# Patient Record
Sex: Female | Born: 1935 | Race: White | Hispanic: No | Smoking: Never smoker
Health system: Southern US, Community
[De-identification: ages and names within clinical notes are randomized; demographics above are authoritative.]

## PROBLEM LIST (undated history)

## (undated) DIAGNOSIS — F411 Generalized anxiety disorder: Secondary | ICD-10-CM

## (undated) DIAGNOSIS — M1990 Unspecified osteoarthritis, unspecified site: Secondary | ICD-10-CM

## (undated) DIAGNOSIS — E119 Type 2 diabetes mellitus without complications: Secondary | ICD-10-CM

## (undated) DIAGNOSIS — I10 Essential (primary) hypertension: Secondary | ICD-10-CM

## (undated) DIAGNOSIS — IMO0001 Reserved for inherently not codable concepts without codable children: Secondary | ICD-10-CM

## (undated) HISTORY — PX: HIP OPEN REDUCTION: SHX1755

## (undated) HISTORY — DX: Essential (primary) hypertension: I10

## (undated) HISTORY — DX: Unspecified osteoarthritis, unspecified site: M19.90

## (undated) HISTORY — DX: Generalized anxiety disorder: F41.1

## (undated) HISTORY — DX: Type 2 diabetes mellitus without complications: E11.9

---

## 2004-04-24 ENCOUNTER — Ambulatory Visit: Payer: Self-pay | Admitting: Internal Medicine

## 2004-07-25 ENCOUNTER — Ambulatory Visit: Payer: Self-pay | Admitting: Internal Medicine

## 2004-10-26 ENCOUNTER — Ambulatory Visit: Payer: Self-pay | Admitting: Internal Medicine

## 2005-02-26 ENCOUNTER — Ambulatory Visit: Payer: Self-pay | Admitting: Internal Medicine

## 2005-06-25 ENCOUNTER — Ambulatory Visit: Payer: Self-pay | Admitting: Internal Medicine

## 2005-09-25 ENCOUNTER — Ambulatory Visit: Payer: Self-pay | Admitting: Internal Medicine

## 2005-10-03 ENCOUNTER — Ambulatory Visit: Payer: Self-pay | Admitting: Internal Medicine

## 2006-02-04 ENCOUNTER — Ambulatory Visit: Payer: Self-pay | Admitting: Internal Medicine

## 2006-05-16 ENCOUNTER — Ambulatory Visit: Payer: Self-pay | Admitting: Internal Medicine

## 2006-10-04 ENCOUNTER — Ambulatory Visit: Payer: Self-pay | Admitting: Internal Medicine

## 2006-10-04 LAB — CONVERTED CEMR LAB
ALT: 15 units/L (ref 0–40)
AST: 19 units/L (ref 0–37)
Bilirubin, Direct: 0.1 mg/dL (ref 0.0–0.3)
CO2: 29 meq/L (ref 19–32)
Calcium: 10.7 mg/dL — ABNORMAL HIGH (ref 8.4–10.5)
Chloride: 106 meq/L (ref 96–112)
Cholesterol: 197 mg/dL (ref 0–200)
GFR calc Af Amer: 78 mL/min
Glucose, Bld: 63 mg/dL — ABNORMAL LOW (ref 70–99)
HCT: 35.6 % — ABNORMAL LOW (ref 36.0–46.0)
Hemoglobin: 12.2 g/dL (ref 12.0–15.0)
LDL Cholesterol: 130 mg/dL — ABNORMAL HIGH (ref 0–99)
MCV: 90.7 fL (ref 78.0–100.0)
Platelets: 355 10*3/uL (ref 150–400)
Sodium: 141 meq/L (ref 135–145)
Total Protein: 7 g/dL (ref 6.0–8.3)

## 2006-12-19 ENCOUNTER — Encounter: Payer: Self-pay | Admitting: Internal Medicine

## 2006-12-19 DIAGNOSIS — I10 Essential (primary) hypertension: Secondary | ICD-10-CM | POA: Insufficient documentation

## 2006-12-19 DIAGNOSIS — E119 Type 2 diabetes mellitus without complications: Secondary | ICD-10-CM

## 2006-12-19 DIAGNOSIS — E1149 Type 2 diabetes mellitus with other diabetic neurological complication: Secondary | ICD-10-CM | POA: Insufficient documentation

## 2006-12-19 DIAGNOSIS — M1990 Unspecified osteoarthritis, unspecified site: Secondary | ICD-10-CM

## 2006-12-19 DIAGNOSIS — F411 Generalized anxiety disorder: Secondary | ICD-10-CM

## 2006-12-19 HISTORY — DX: Generalized anxiety disorder: F41.1

## 2006-12-19 HISTORY — DX: Unspecified osteoarthritis, unspecified site: M19.90

## 2006-12-19 HISTORY — DX: Type 2 diabetes mellitus without complications: E11.9

## 2006-12-19 HISTORY — DX: Essential (primary) hypertension: I10

## 2007-01-03 ENCOUNTER — Ambulatory Visit: Payer: Self-pay | Admitting: Internal Medicine

## 2007-01-06 ENCOUNTER — Telehealth: Payer: Self-pay | Admitting: Internal Medicine

## 2007-04-15 ENCOUNTER — Ambulatory Visit: Payer: Self-pay | Admitting: Internal Medicine

## 2007-08-15 ENCOUNTER — Ambulatory Visit: Payer: Self-pay | Admitting: Internal Medicine

## 2007-08-15 LAB — CONVERTED CEMR LAB: Hgb A1c MFr Bld: 5.7 % (ref 4.6–6.0)

## 2007-11-13 ENCOUNTER — Ambulatory Visit: Payer: Self-pay | Admitting: Internal Medicine

## 2008-02-16 ENCOUNTER — Ambulatory Visit: Payer: Self-pay | Admitting: Internal Medicine

## 2008-02-16 LAB — CONVERTED CEMR LAB: Hgb A1c MFr Bld: 6.5 % — ABNORMAL HIGH (ref 4.6–6.0)

## 2008-04-29 ENCOUNTER — Encounter: Payer: Self-pay | Admitting: Internal Medicine

## 2008-05-18 ENCOUNTER — Ambulatory Visit: Payer: Self-pay | Admitting: Internal Medicine

## 2008-05-18 DIAGNOSIS — J069 Acute upper respiratory infection, unspecified: Secondary | ICD-10-CM | POA: Insufficient documentation

## 2008-05-18 LAB — CONVERTED CEMR LAB: Hgb A1c MFr Bld: 6.4 % — ABNORMAL HIGH (ref 4.6–6.0)

## 2008-08-17 ENCOUNTER — Ambulatory Visit: Payer: Self-pay | Admitting: Internal Medicine

## 2008-08-17 LAB — CONVERTED CEMR LAB: Hgb A1c MFr Bld: 6.1 % (ref 4.6–6.5)

## 2008-10-27 ENCOUNTER — Telehealth (INDEPENDENT_AMBULATORY_CARE_PROVIDER_SITE_OTHER): Payer: Self-pay | Admitting: *Deleted

## 2009-01-25 ENCOUNTER — Ambulatory Visit: Payer: Self-pay | Admitting: Internal Medicine

## 2009-01-25 LAB — CONVERTED CEMR LAB: Hgb A1c MFr Bld: 6 % (ref 4.6–6.5)

## 2009-06-17 ENCOUNTER — Ambulatory Visit: Payer: Self-pay | Admitting: Internal Medicine

## 2009-09-16 ENCOUNTER — Ambulatory Visit: Payer: Self-pay | Admitting: Internal Medicine

## 2009-09-16 LAB — CONVERTED CEMR LAB: Hgb A1c MFr Bld: 6.2 % — ABNORMAL HIGH (ref <5.7)

## 2009-12-16 ENCOUNTER — Ambulatory Visit: Payer: Self-pay | Admitting: Internal Medicine

## 2009-12-16 DIAGNOSIS — H6120 Impacted cerumen, unspecified ear: Secondary | ICD-10-CM | POA: Insufficient documentation

## 2009-12-16 LAB — CONVERTED CEMR LAB: Hgb A1c MFr Bld: 6.8 % — ABNORMAL HIGH (ref 4.6–6.5)

## 2010-03-16 ENCOUNTER — Ambulatory Visit: Payer: Self-pay | Admitting: Internal Medicine

## 2010-03-16 LAB — HM DIABETES EYE EXAM: HM Diabetic Eye Exam: NORMAL

## 2010-04-27 ENCOUNTER — Encounter: Payer: Self-pay | Admitting: Internal Medicine

## 2010-06-07 ENCOUNTER — Encounter: Payer: Self-pay | Admitting: Internal Medicine

## 2010-06-11 LAB — CONVERTED CEMR LAB
Albumin: 4.1 g/dL (ref 3.5–5.2)
Alkaline Phosphatase: 48 units/L (ref 39–117)
Basophils Absolute: 0 10*3/uL (ref 0.0–0.1)
CO2: 28 meq/L (ref 19–32)
Calcium: 10.4 mg/dL (ref 8.4–10.5)
Chloride: 105 meq/L (ref 96–112)
Cholesterol: 192 mg/dL (ref 0–200)
Creatinine, Ser: 1 mg/dL (ref 0.4–1.2)
Eosinophils Absolute: 0.1 10*3/uL (ref 0.0–0.7)
Glucose, Bld: 112 mg/dL — ABNORMAL HIGH (ref 70–99)
HDL: 52.4 mg/dL (ref >39.00)
Hgb A1c MFr Bld: 6.1 % (ref 4.6–6.5)
Lymphocytes Relative: 22.9 % (ref 12.0–46.0)
MCHC: 33 g/dL (ref 30.0–36.0)
MCV: 93.9 fL (ref 78.0–100.0)
Monocytes Absolute: 0.6 10*3/uL (ref 0.1–1.0)
Neutro Abs: 3.8 10*3/uL (ref 1.4–7.7)
Neutrophils Relative %: 65.1 % (ref 43.0–77.0)
RDW: 13.4 % (ref 11.5–14.6)
Total Protein: 7.2 g/dL (ref 6.0–8.3)
Triglycerides: 128 mg/dL (ref 0.0–149.0)

## 2010-06-13 NOTE — Assessment & Plan Note (Signed)
Summary: 3 mo fup/jlp  And a whole in a  Vital Signs:  Patient profile:   77 year old female Weight:      148 pounds Temp:     98.1 degrees F oral BP sitting:   120 / 68  (left arm) Cuff size:   regular  Vitals Entered By: Duard Brady LPN (Sep 16, 1608 3:49 PM) CC: 3 mos rov - doing well     fbs 172 Is Patient Diabetic? Yes Did you bring your meter with you today? No   CC:  3 mos rov - doing well     fbs 172.  History of Present Illness: a 77 year old patient who is seen today for follow-up of her hypertension and type 2 diabetes.  She states that her fasting blood sugars in the morning have trended up over the past few months.  Her last hemoglobin A1c was 6.1.  she also describes some orthostatic dizziness.  She has zoster, arthritis, and anxiety that have been stable.  Her weight is down 2 pounds  Preventive Screening-Counseling & Management  Alcohol-Tobacco     Smoking Status: never  Allergies (verified): No Known Drug Allergies  Review of Systems  The patient denies anorexia, fever, weight loss, weight gain, vision loss, decreased hearing, hoarseness, chest pain, syncope, dyspnea on exertion, peripheral edema, prolonged cough, headaches, hemoptysis, abdominal pain, melena, hematochezia, severe indigestion/heartburn, hematuria, incontinence, genital sores, muscle weakness, suspicious skin lesions, transient blindness, difficulty walking, depression, unusual weight change, abnormal bleeding, enlarged lymph nodes, angioedema, and breast masses.    Physical Exam  General:  overweight-appearing.  120/64overweight-appearing.   Head:  Normocephalic and atraumatic without obvious abnormalities. No apparent alopecia or balding. Eyes:  No corneal or conjunctival inflammation noted. EOMI. Perrla. Funduscopic exam benign, without hemorrhages, exudates or papilledema. Vision grossly normal. Mouth:  Oral mucosa and oropharynx without lesions or exudates.  Teeth in good  repair. Neck:  No deformities, masses, or tenderness noted. Lungs:  Normal respiratory effort, chest expands symmetrically. Lungs are clear to auscultation, no crackles or wheezes. Heart:  Normal rate and regular rhythm. S1 and S2 normal without gallop, murmur, click, rub or other extra sounds. Abdomen:  Bowel sounds positive,abdomen soft and non-tender without masses, organomegaly or hernias noted. Msk:  No deformity or scoliosis noted of thoracic or lumbar spine.     Impression & Recommendations:  Problem # 1:  HYPERTENSION (ICD-401.9)  The following medications were removed from the medication list:    Lotensin Hct 20-12.5 Mg Tabs (Benazepril-hydrochlorothiazide) .Marland Kitchen... 1 once daily Her updated medication list for this problem includes:    Verapamil Hcl Cr 240 Mg Cp24 (Verapamil hcl) ..... One  at bedtime    Benazepril Hcl 20 Mg Tabs (Benazepril hcl)    The following medications were removed from the medication list:    Lotensin Hct 20-12.5 Mg Tabs (Benazepril-hydrochlorothiazide) .Marland Kitchen... 1 once daily Her updated medication list for this problem includes:    Verapamil Hcl Cr 240 Mg Cp24 (Verapamil hcl) ..... One  at bedtime    Benazepril Hcl 20 Mg Tabs (Benazepril hcl)  Problem # 2:  DIABETES MELLITUS, TYPE II (ICD-250.00)  The following medications were removed from the medication list:    Lotensin Hct 20-12.5 Mg Tabs (Benazepril-hydrochlorothiazide) .Marland Kitchen... 1 once daily Her updated medication list for this problem includes:    Metformin Hcl 500 Mg Xr24h-tab (Metformin hcl) ..... One tablet daily    Benazepril Hcl 20 Mg Tabs (Benazepril hcl)    The following medications  were removed from the medication list:    Lotensin Hct 20-12.5 Mg Tabs (Benazepril-hydrochlorothiazide) .Marland Kitchen... 1 once daily Her updated medication list for this problem includes:    Metformin Hcl 500 Mg Xr24h-tab (Metformin hcl) ..... One tablet daily    Benazepril Hcl 20 Mg Tabs (Benazepril hcl)  Problem # 3:   OSTEOARTHRITIS (ICD-715.90)  Problem # 4:  ANXIETY (ICD-300.00)  Her updated medication list for this problem includes:    Ativan 0.5 Mg Tabs (Lorazepam) .Marland Kitchen... 1 at bedtime  Her updated medication list for this problem includes:    Ativan 0.5 Mg Tabs (Lorazepam) .Marland Kitchen... 1 at bedtime  Complete Medication List: 1)  Ativan 0.5 Mg Tabs (Lorazepam) .Marland Kitchen.. 1 at bedtime 2)  Verapamil Hcl Cr 240 Mg Cp24 (Verapamil hcl) .... One  at bedtime 3)  Metformin Hcl 500 Mg Xr24h-tab (Metformin hcl) .... One tablet daily 4)  Benazepril Hcl 20 Mg Tabs (Benazepril hcl)  Other Orders: Venipuncture (56213) TLB-A1C / Hgb A1C (Glycohemoglobin) (83036-A1C) Prescription Created Electronically [phone])  Patient Instructions: 1)  Please schedule a follow-up appointment in 3 months. 2)  It is important that you exercise regularly at least 20 minutes 5 times a week. If you develop chest pain, have severe difficulty breathing, or feel very tired , stop exercising immediately and seek medical attention. 3)  You need to lose weight. Consider a lower calorie diet and regular exercise.  4)  Check your blood sugars regularly. If your readings are usually above : or below 70 you should contact our office. 5)  It is important that your Diabetic A1c level is checked every 3 months. 6)  See your eye doctor yearly to check for diabetic eye damage. Prescriptions: METFORMIN HCL 500 MG XR24H-TAB (METFORMIN HCL) one tablet daily  #90 x 6   Entered and Authorized by:   Gordy Savers  MD   Signed by:   Gordy Savers  MD on 09/16/2009   Method used:   Print then Give to Patient   RxID:   [id number] VERAPAMIL HCL CR 240 MG  CP24 (VERAPAMIL HCL) one  at bedtime  #90 x 6   Entered and Authorized by:   Gordy Savers  MD   Signed by:   Gordy Savers  MD on 09/16/2009   Method used:   Print then Give to Patient   RxID:   [id number] ATIVAN 0.5 MG TABS (LORAZEPAM) 1 at bedtime  #90 x 2   Entered and  Authorized by:   Gordy Savers  MD   Signed by:   Gordy Savers  MD on 09/16/2009   Method used:   Print then Give to Patient   RxID:   [id number] BENAZEPRIL HCL 20 MG TABS (BENAZEPRIL HCL)   #90 x 6   Entered and Authorized by:   Gordy Savers  MD   Signed by:   Gordy Savers  MD on 09/16/2009   Method used:   Print then Give to Patient   RxID:   [id number] METFORMIN HCL 500 MG XR24H-TAB (METFORMIN HCL) one tablet daily  #90 x 6   Entered and Authorized by:   Gordy Savers  MD   Signed by:   Gordy Savers  MD on 09/16/2009   Method used:   Electronically to        [facility]  [phone]* (retail)       [address] [place]  [place], [place]  [address]       Ph: [phone] or [phone]       Fax: [phone]   RxID:   [id number] VERAPAMIL HCL CR 240 MG  CP24 (VERAPAMIL HCL) one  at bedtime  #90 x 6   Entered and Authorized by:   Gordy Savers  MD   Signed by:   Gordy Savers  MD on 09/16/2009   Method used:   Electronically to        [facility]  [phone]* (retail)       [address]       [place], [place]  [address]       Ph: [phone] or [phone]       Fax: [phone]   RxID:   [id number]

## 2010-06-13 NOTE — Assessment & Plan Note (Signed)
Summary: pt will come in fasting/njr----PT [facility] // RS   Vital Signs:  Patient profile:   77 year old female Weight:      150 pounds Temp:     98.3 degrees F oral BP sitting:   148 / 70  Vitals Entered By: Raechel Ache, RN (June 17, 2009 9:39 AM) CC: CPX , no c/o   BS running 120's   CC:  CPX  and no c/o   BS running 120's.  History of Present Illness: 77 year old patient who is seen today for a comprehensive evaluation.  Medical problems include hypertension, diabetes, and anxiety disorder.  her diabetes has been under excellent control.  Presently, she is on Actos 30 mg daily, only.  Her diabetic regimen has included this medicine since her initial visit.  Apparently, she has never been on metformin.  She does request a less expensive option.  Her him go.  A1c6.0.  She has been on glipizide in the past, which was discontinued.  She remains on triple therapy for blood pressure control.  She also has a history of osteoarthritis, which has been stable.  Allergies: No Known Drug Allergies  Past History:  Past Medical History: Reviewed history from 12/19/2006 and no changes required. Anxiety Diabetes mellitus, type II Hypertension Osteoarthritis  Past Surgical History: Hip fracture ORIF colonoscopy-none (declines)  Family History: Reviewed history from 04/15/2007 and no changes required. father died age 67, lung cancer mother died age 93, because of the diabetes also, status post nephrectomy for renal cancer  One brother died age 71 pancreatic cancer one sister, history of COPD, status post CVA deceased age 45 one brother, history of COPD  Social History: Reviewed history from 05/18/2008 and no changes required. recently  lost her daughter-in-law to illness husband status post recent stroke  Review of Systems  The patient denies anorexia, fever, weight loss, weight gain, vision loss, decreased hearing, hoarseness, chest pain, syncope, dyspnea on exertion,  peripheral edema, prolonged cough, headaches, hemoptysis, abdominal pain, melena, hematochezia, severe indigestion/heartburn, hematuria, incontinence, genital sores, muscle weakness, suspicious skin lesions, transient blindness, difficulty walking, depression, unusual weight change, abnormal bleeding, enlarged lymph nodes, angioedema, and breast masses.    Physical Exam  General:  overweight-appearing.  140/64overweight-appearing.   Head:  Normocephalic and atraumatic without obvious abnormalities. No apparent alopecia or balding. Eyes:  No corneal or conjunctival inflammation noted. EOMI. Perrla. Funduscopic exam benign, without hemorrhages, exudates or papilledema. Vision grossly normal. Ears:  External ear exam shows no significant lesions or deformities.  Otoscopic examination reveals clear canals, tympanic membranes are intact bilaterally without bulging, retraction, inflammation or discharge. Hearing is grossly normal bilaterally. Nose:  External nasal examination shows no deformity or inflammation. Nasal mucosa are pink and moist without lesions or exudates. Mouth:  Oral mucosa and oropharynx without lesions or exudates.  edentulous Neck:  No deformities, masses, or tenderness noted. Chest Wall:  No deformities, masses, or tenderness noted. Breasts:  No mass, nodules, thickening, tenderness, bulging, retraction, inflamation, nipple discharge or skin changes noted.   Lungs:  Normal respiratory effort, chest expands symmetrically. Lungs are clear to auscultation, no crackles or wheezes. Heart:  Normal rate and regular rhythm. S1 and S2 normal without gallop, murmur, click, rub or other extra sounds. Abdomen:  Bowel sounds positive,abdomen soft and non-tender without masses, organomegaly or hernias noted. Rectal:  No external abnormalities noted. Normal sphincter tone. No rectal masses or tenderness. Genitalia:  atrophic changes only Msk:  No deformity or scoliosis noted  of thoracic or lumbar  spine.   Pulses:  R and L carotid,radial,femoral,dorsalis pedis and posterior tibial pulses are full and equal bilaterally  Diabetes Management Exam:    Foot Exam (with socks and/or shoes not present):       Sensory-Pinprick/Light touch:          Left medial foot (L-4): normal          Left dorsal foot (L-5): normal          Left lateral foot (S-1): normal          Right medial foot (L-4): normal          Right dorsal foot (L-5): normal          Right lateral foot (S-1): normal       Sensory-Monofilament:          Left foot: normal          Right foot: normal       Inspection:          Left foot: normal          Right foot: normal       Nails:          Left foot: normal          Right foot: normal    Foot Exam by Podiatrist:       Date: 06/17/2009       Results: no diabetic findings       Done by: PCP   Impression & Recommendations:  Problem # 1:  OSTEOARTHRITIS (ICD-715.90)  Orders: Venipuncture (04540) TLB-Lipid Panel (80061-LIPID) TLB-BMP (Basic Metabolic Panel-BMET) (80048-METABOL) TLB-CBC Platelet - w/Differential (85025-CBCD) TLB-Hepatic/Liver Function Pnl (80076-HEPATIC)  Problem # 2:  HYPERTENSION (ICD-401.9)  Her updated medication list for this problem includes:    Lotensin Hct 20-12.5 Mg Tabs (Benazepril-hydrochlorothiazide) .Marland Kitchen... 1 once daily    Verapamil Hcl Cr 240 Mg Cp24 (Verapamil hcl) ..... One  at bedtime  Orders: EKG w/ Interpretation (93000) Venipuncture (98119) TLB-Lipid Panel (80061-LIPID) TLB-BMP (Basic Metabolic Panel-BMET) (80048-METABOL) TLB-CBC Platelet - w/Differential (85025-CBCD) TLB-Hepatic/Liver Function Pnl (80076-HEPATIC) TLB-TSH (Thyroid Stimulating Hormone) (84443-TSH)  Her updated medication list for this problem includes:    Lotensin Hct 20-12.5 Mg Tabs (Benazepril-hydrochlorothiazide) .Marland Kitchen... 1 once daily    Verapamil Hcl Cr 240 Mg Cp24 (Verapamil hcl) ..... One  at bedtime  Problem # 3:  DIABETES MELLITUS, TYPE II  (ICD-250.00)  The following medications were removed from the medication list:    Actos 30 Mg Tabs (Pioglitazone hcl) .Marland Kitchen... 1 once daily Her updated medication list for this problem includes:    Lotensin Hct 20-12.5 Mg Tabs (Benazepril-hydrochlorothiazide) .Marland Kitchen... 1 once daily    Metformin Hcl 500 Mg Xr24h-tab (Metformin hcl) ..... One tablet daily  The following medications were removed from the medication list:    Actos 30 Mg Tabs (Pioglitazone hcl) .Marland Kitchen... 1 once daily Her updated medication list for this problem includes:    Lotensin Hct 20-12.5 Mg Tabs (Benazepril-hydrochlorothiazide) .Marland Kitchen... 1 once daily    Metformin Hcl 500 Mg Xr24h-tab (Metformin hcl) ..... One tablet daily  Orders: Venipuncture (14782) TLB-Lipid Panel (80061-LIPID) TLB-BMP (Basic Metabolic Panel-BMET) (80048-METABOL) TLB-CBC Platelet - w/Differential (85025-CBCD) TLB-Hepatic/Liver Function Pnl (80076-HEPATIC) TLB-TSH (Thyroid Stimulating Hormone) (84443-TSH) TLB-A1C / Hgb A1C (Glycohemoglobin) (83036-A1C)  Problem # 4:  ANXIETY (ICD-300.00)  Her updated medication list for this problem includes:    Ativan 0.5 Mg Tabs (Lorazepam) .Marland Kitchen... 1 at bedtime  Her updated medication list for this problem includes:  Ativan 0.5 Mg Tabs (Lorazepam) .Marland Kitchen... 1 at bedtime  Orders: Venipuncture (16109) TLB-Lipid Panel (80061-LIPID) TLB-BMP (Basic Metabolic Panel-BMET) (80048-METABOL) TLB-CBC Platelet - w/Differential (85025-CBCD) TLB-Hepatic/Liver Function Pnl (80076-HEPATIC)  Complete Medication List: 1)  Ativan 0.5 Mg Tabs (Lorazepam) .Marland Kitchen.. 1 at bedtime 2)  Lotensin Hct 20-12.5 Mg Tabs (Benazepril-hydrochlorothiazide) .Marland Kitchen.. 1 once daily 3)  Verapamil Hcl Cr 240 Mg Cp24 (Verapamil hcl) .... One  at bedtime 4)  Metformin Hcl 500 Mg Xr24h-tab (Metformin hcl) .... One tablet daily  Other Orders: Peak Flow Rate (94150) Pelvic & Breast Exam ( Medicare)  (U0454)  Patient Instructions: 1)  Please schedule a follow-up  appointment in 3 months. 2)  It is important that you exercise regularly at least 20 minutes 5 times a week. If you develop chest pain, have severe difficulty breathing, or feel very tired , stop exercising immediately and seek medical attention. 3)  You need to lose weight. Consider a lower calorie diet and regular exercise.  4)  Take calcium +Vitamin D daily. 5)  Check your blood sugars regularly. If your readings are usually above : or below 70 you should contact our office. 6)  It is important that your Diabetic A1c level is checked every 3 months. 7)  See your eye doctor yearly to check for diabetic eye damage. Prescriptions: METFORMIN HCL 500 MG XR24H-TAB (METFORMIN HCL) one tablet daily  #90 x 6   Entered and Authorized by:   Gordy Savers  MD   Signed by:   Gordy Savers  MD on 06/17/2009   Method used:   Print then Give to Patient   RxID:   [id number] VERAPAMIL HCL CR 240 MG  CP24 (VERAPAMIL HCL) one  at bedtime  #90 x 6   Entered and Authorized by:   Gordy Savers  MD   Signed by:   Gordy Savers  MD on 06/17/2009   Method used:   Print then Give to Patient   RxID:   [id number] LOTENSIN HCT 20-12.5 MG  TABS (BENAZEPRIL-HYDROCHLOROTHIAZIDE) 1 once daily  #90 Each x 5   Entered and Authorized by:   Gordy Savers  MD   Signed by:   Gordy Savers  MD on 06/17/2009   Method used:   Print then Give to Patient   RxID:   [id number] ATIVAN 0.5 MG TABS (LORAZEPAM) 1 at bedtime  #90 x 2   Entered and Authorized by:   Gordy Savers  MD   Signed by:   Gordy Savers  MD on 06/17/2009   Method used:   Print then Give to Patient   RxID:   [id number]

## 2010-06-13 NOTE — Assessment & Plan Note (Signed)
Summary: 3 month rov/njr   Vital Signs:  Patient profile:   77 year old female Weight:      140 pounds Temp:     98.0 degrees F oral BP sitting:   124 / 80  (left arm) Cuff size:   regular  Vitals Entered By: Duard Brady LPN (December 16, 2009 12:48 PM) CC: 3 mos rov - doing ok , c/o ear pain       fbs 142 Is Patient Diabetic? Yes Did you bring your meter with you today? No   CC:  3 mos rov - doing ok  and c/o ear pain       fbs 142.  History of Present Illness: 60 old seen today for follow-up of her type 2 diabetes.  She has maintained very nice glycemic control, and her last hemoglobin A1c3 months ago, was 6.2.  since her last visit here.  She has lost 8 pounds.  Her only complaint is right ear discomfort and diminished R. Torrey acuity.  She has hypertension and osteoarthritis.  She also has a history of mild anxiety.  Allergies (verified): No Known Drug Allergies  Past History:  Past Medical History: Reviewed history from 12/19/2006 and no changes required. Anxiety Diabetes mellitus, type II Hypertension Osteoarthritis  Past Surgical History: Reviewed history from 06/17/2009 and no changes required. Hip fracture ORIF colonoscopy-none (declines)  Review of Systems       The patient complains of decreased hearing.  The patient denies anorexia, fever, weight loss, weight gain, vision loss, hoarseness, chest pain, syncope, dyspnea on exertion, peripheral edema, prolonged cough, headaches, hemoptysis, abdominal pain, melena, hematochezia, severe indigestion/heartburn, hematuria, incontinence, genital sores, muscle weakness, suspicious skin lesions, transient blindness, difficulty walking, depression, unusual weight change, abnormal bleeding, enlarged lymph nodes, angioedema, and breast masses.    Physical Exam  General:  overweight-appearing.  120/72overweight-appearing.   Head:  Normocephalic and atraumatic without obvious abnormalities. No apparent alopecia or  balding. Eyes:  No corneal or conjunctival inflammation noted. EOMI. Perrla. Funduscopic exam benign, without hemorrhages, exudates or papilledema. Vision grossly normal. Mouth:  Oral mucosa and oropharynx without lesions or exudates.  Teeth in good repair. Neck:  No deformities, masses, or tenderness noted. Lungs:  Normal respiratory effort, chest expands symmetrically. Lungs are clear to auscultation, no crackles or wheezes. Heart:  Normal rate and regular rhythm. S1 and S2 normal without gallop, murmur, click, rub or other extra sounds. Abdomen:  Bowel sounds positive,abdomen soft and non-tender without masses, organomegaly or hernias noted. Msk:  No deformity or scoliosis noted of thoracic or lumbar spine.   Pulses:  R and L carotid,radial,femoral,dorsalis pedis and posterior tibial pulses are full and equal bilaterally  Diabetes Management Exam:    Foot Exam (with socks and/or shoes not present):       Sensory-Pinprick/Light touch:          Left medial foot (L-4): normal          Left dorsal foot (L-5): normal          Left lateral foot (S-1): normal          Right medial foot (L-4): normal          Right dorsal foot (L-5): normal          Right lateral foot (S-1): normal       Sensory-Monofilament:          Left foot: normal          Right foot: normal  Inspection:          Left foot: normal          Right foot: normal       Nails:          Left foot: normal          Right foot: normal    Foot Exam by Podiatrist:       Date: 12/16/2009       Results: no diabetic findings       Done by: PCP   Impression & Recommendations:  Problem # 1:  CERUMEN IMPACTION, RIGHT (ICD-380.4) right ear  canal was irrigated till clear  Problem # 2:  HYPERTENSION (ICD-401.9)  Her updated medication list for this problem includes:    Verapamil Hcl Cr 240 Mg Cp24 (Verapamil hcl) ..... One  at bedtime    Benazepril Hcl 20 Mg Tabs (Benazepril hcl)  Her updated medication list for this  problem includes:    Verapamil Hcl Cr 240 Mg Cp24 (Verapamil hcl) ..... One  at bedtime    Benazepril Hcl 20 Mg Tabs (Benazepril hcl)  Problem # 3:  DIABETES MELLITUS, TYPE II (ICD-250.00)  Her updated medication list for this problem includes:    Metformin Hcl 500 Mg Xr24h-tab (Metformin hcl) ..... One tablet daily    Benazepril Hcl 20 Mg Tabs (Benazepril hcl)    Her updated medication list for this problem includes:    Metformin Hcl 500 Mg Xr24h-tab (Metformin hcl) ..... One tablet daily    Benazepril Hcl 20 Mg Tabs (Benazepril hcl)  Orders: Venipuncture (45409) Specimen Handling (81191) TLB-A1C / Hgb A1C (Glycohemoglobin) (83036-A1C)  Complete Medication List: 1)  Ativan 0.5 Mg Tabs (Lorazepam) .Marland Kitchen.. 1 at bedtime 2)  Verapamil Hcl Cr 240 Mg Cp24 (Verapamil hcl) .... One  at bedtime 3)  Metformin Hcl 500 Mg Xr24h-tab (Metformin hcl) .... One tablet daily 4)  Benazepril Hcl 20 Mg Tabs (Benazepril hcl)  Patient Instructions: 1)  Please schedule a follow-up appointment in 3 months. 2)  Limit your Sodium (Salt). 3)  It is important that you exercise regularly at least 20 minutes 5 times a week. If you develop chest pain, have severe difficulty breathing, or feel very tired , stop exercising immediately and seek medical attention. 4)  You need to lose weight. Consider a lower calorie diet and regular exercise.  5)  Check your blood sugars regularly. If your readings are usually above : or below 70 you should contact our office. 6)  It is important that your Diabetic A1c level is checked every 3 months. 7)  See your eye doctor yearly to check for diabetic eye damage. Prescriptions: ATIVAN 0.5 MG TABS (LORAZEPAM) 1 at bedtime  #90 x 2   Entered and Authorized by:   Gordy Savers  MD   Signed by:   Gordy Savers  MD on 12/16/2009   Method used:   Print then Give to Patient   RxID:   [id number]

## 2010-06-13 NOTE — Assessment & Plan Note (Signed)
Summary: 3 month fup//ccm   Vital Signs:  Patient profile:   77 year old female Weight:      138 pounds Temp:     98.4 degrees F oral BP sitting:   130 / 80  (right arm) Cuff size:   regular  Vitals Entered By: Duard Brady LPN (March 16, 2010 3:21 PM) CC: 3 mos rov - doing well    fbs 148 Is Patient Diabetic? Yes Did you bring your meter with you today? No   CC:  3 mos rov - doing well    fbs 148.  History of Present Illness: 77 year old patient who is seen today for follow-up of hypertension, type 2 diabetes, foster, arthritis.  She has mild anxiety, which has been stable.  She is maintained the ninth glycemic control.  Her weight has been stable.  Medical regimen includes verapamil.  Denies any constipation issues.  She also remains on ACE inhibition, which she continues to tolerate well.  Her arthritis has been stable.  Allergies (verified): No Known Drug Allergies  Past History:  Past Medical History: Reviewed history from 12/19/2006 and no changes required. Anxiety Diabetes mellitus, type II Hypertension Osteoarthritis  Review of Systems  The patient denies anorexia, fever, weight loss, weight gain, vision loss, decreased hearing, hoarseness, chest pain, syncope, dyspnea on exertion, peripheral edema, prolonged cough, headaches, hemoptysis, abdominal pain, melena, hematochezia, hematuria, incontinence, genital sores, muscle weakness, suspicious skin lesions, transient blindness, difficulty walking, depression, unusual weight change, abnormal bleeding, enlarged lymph nodes, angioedema, breast masses, and testicular masses.    Physical Exam  General:  Well-developed,well-nourished,in no acute distress; alert,appropriate and cooperative throughout examination Head:  Normocephalic and atraumatic without obvious abnormalities. No apparent alopecia or balding. Eyes:  No corneal or conjunctival inflammation noted. EOMI. Perrla. Funduscopic exam benign, without  hemorrhages, exudates or papilledema. Vision grossly normal. Mouth:  Oral mucosa and oropharynx without lesions or exudates.  Teeth in good repair. Neck:  No deformities, masses, or tenderness noted. Lungs:  Normal respiratory effort, chest expands symmetrically. Lungs are clear to auscultation, no crackles or wheezes. Heart:  Normal rate and regular rhythm. S1 and S2 normal without gallop, murmur, click, rub or other extra sounds. Abdomen:  Bowel sounds positive,abdomen soft and non-tender without masses, organomegaly or hernias noted.  Diabetes Management Exam:    Eye Exam:       Eye Exam done here today          Results: normal   Impression & Recommendations:  Problem # 1:  DIABETES MELLITUS, TYPE II (ICD-250.00)  Her updated medication list for this problem includes:    Metformin Hcl 500 Mg Xr24h-tab (Metformin hcl) ..... One tablet daily    Benazepril Hcl 20 Mg Tabs (Benazepril hcl)    Her updated medication list for this problem includes:    Metformin Hcl 500 Mg Xr24h-tab (Metformin hcl) ..... One tablet daily    Benazepril Hcl 20 Mg Tabs (Benazepril hcl)  Orders: Venipuncture (16109) TLB-A1C / Hgb A1C (Glycohemoglobin) (83036-A1C) Specimen Handling (60454)  Problem # 2:  HYPERTENSION (ICD-401.9)  Her updated medication list for this problem includes:    Verapamil Hcl Cr 240 Mg Cp24 (Verapamil hcl) ..... One  at bedtime    Benazepril Hcl 20 Mg Tabs (Benazepril hcl)  Her updated medication list for this problem includes:    Verapamil Hcl Cr 240 Mg Cp24 (Verapamil hcl) ..... One  at bedtime    Benazepril Hcl 20 Mg Tabs (Benazepril hcl)  Complete Medication List:  1)  Ativan 0.5 Mg Tabs (Lorazepam) .Marland Kitchen.. 1 at bedtime 2)  Verapamil Hcl Cr 240 Mg Cp24 (Verapamil hcl) .... One  at bedtime 3)  Metformin Hcl 500 Mg Xr24h-tab (Metformin hcl) .... One tablet daily 4)  Benazepril Hcl 20 Mg Tabs (Benazepril hcl)  Patient Instructions: 1)  Please schedule a follow-up  appointment in 3 months. 2)  Limit your Sodium (Salt). 3)  It is important that you exercise regularly at least 20 minutes 5 times a week. If you develop chest pain, have severe difficulty breathing, or feel very tired , stop exercising immediately and seek medical attention. 4)  You need to lose weight. Consider a lower calorie diet and regular exercise.  5)  Check your blood sugars regularly. If your readings are usually above : or below 70 you should contact our office. 6)  It is important that your Diabetic A1c level is checked every 3 months.   Orders Added: 1)  Est. Patient Level III [21308] 2)  Venipuncture [36415] 3)  TLB-A1C / Hgb A1C (Glycohemoglobin) [83036-A1C] 4)  Specimen Handling [99000]   Immunization History:  Influenza Immunization History:    Influenza:  Historical (03/14/2010)   Immunization History:  Influenza Immunization History:    Influenza:  Historical (03/14/2010)  done at [facility]

## 2010-06-15 ENCOUNTER — Telehealth: Payer: Self-pay | Admitting: Internal Medicine

## 2010-06-15 ENCOUNTER — Ambulatory Visit (INDEPENDENT_AMBULATORY_CARE_PROVIDER_SITE_OTHER): Payer: Medicare Other | Admitting: Internal Medicine

## 2010-06-15 ENCOUNTER — Encounter: Payer: Self-pay | Admitting: Internal Medicine

## 2010-06-15 ENCOUNTER — Ambulatory Visit: Admit: 2010-06-15 | Payer: Self-pay | Admitting: Internal Medicine

## 2010-06-15 DIAGNOSIS — E119 Type 2 diabetes mellitus without complications: Secondary | ICD-10-CM

## 2010-06-15 DIAGNOSIS — M1990 Unspecified osteoarthritis, unspecified site: Secondary | ICD-10-CM

## 2010-06-15 DIAGNOSIS — I10 Essential (primary) hypertension: Secondary | ICD-10-CM

## 2010-06-15 MED ORDER — BENAZEPRIL HCL 20 MG PO TABS: 20.0000 mg | ORAL_TABLET | Freq: Every day | ORAL | Status: DC

## 2010-06-15 NOTE — Medication Information (Signed)
Summary: Diabetes Supplies/[facility]  Diabetes Supplies/[facility]   Imported By: Lanelle Bal 05/05/2010 10:58:41  _____________________________________________________________________  External Attachment:    Type:   Image     Comment:   External Document

## 2010-06-15 NOTE — Progress Notes (Signed)
  Subjective:    Patient ID: Kathy Beltran, female    DOB: [birth year], 77 y.o.   MRN: [id number]  HPI  35 -year-old patient who is seen today for follow-up.  She has treated hypertension, currently controlled on verapamil 240 mg daily.  She has been on Lotensin in the past, but apparently this has been discontinued.  She is no longer taking this medication, although it is on her medication list. She has a history of type 2 diabetes.  Random blood sugar today 129; Her last hemoglobin A1c6.8.  Denies any hypoglycemic symptoms. She has a history of osteoarthritis, which has been stable.  No new concerns or complaints She has a history of chronic anxiety controlled with p.r.n. Lorazepam.  This has been stable    Review of Systems  Constitutional: Negative.  Negative for fever and fatigue.  HENT: Negative for hearing loss, congestion, sore throat, rhinorrhea, dental problem, sinus pressure and tinnitus.   Eyes: Negative for pain, discharge and visual disturbance.  Respiratory: Negative for cough and shortness of breath.   Cardiovascular: Negative for chest pain, palpitations and leg swelling.  Gastrointestinal: Negative for nausea, vomiting, abdominal pain, diarrhea, constipation, blood in stool and abdominal distention.  Genitourinary: Negative for dysuria, urgency, frequency, hematuria, flank pain, vaginal bleeding, vaginal discharge, difficulty urinating, vaginal pain and pelvic pain.  Musculoskeletal: Negative for joint swelling, arthralgias and gait problem.  Skin: Negative for rash.  Neurological: Negative for dizziness, syncope, speech difficulty, weakness, numbness and headaches.  Hematological: Negative for adenopathy. Does not bruise/bleed easily.  Psychiatric/Behavioral: Negative for behavioral problems, dysphoric mood and agitation. The patient is not nervous/anxious.        Objective:   Physical Exam  Constitutional: She is oriented to person, place, and time. She appears  well-developed and well-nourished.  HENT:  Head: Normocephalic.  Right Ear: External ear normal.  Left Ear: External ear normal.  Mouth/Throat: Oropharynx is clear and moist.  Eyes: Conjunctivae and EOM are normal. Pupils are equal, round, and reactive to light.  Neck: Normal range of motion. Neck supple. No thyromegaly present.  Cardiovascular: Normal rate, regular rhythm, normal heart sounds and intact distal pulses.   Pulmonary/Chest: Effort normal and breath sounds normal.  Abdominal: Soft. Bowel sounds are normal. She exhibits no mass. There is no tenderness.  Musculoskeletal: Normal range of motion. She exhibits no edema and no tenderness.  Lymphadenopathy:    She has no cervical adenopathy.  Neurological: She is alert and oriented to person, place, and time.  Skin: Skin is warm and dry. No rash noted.  Psychiatric: She has a normal mood and affect. Her behavior is normal.          Assessment & Plan:  1.  Diabetes mellitus-seems fairly stable.  Last him go.  A1c controlled at 6.8.  Will recheck today 2.  Osteoarthritis- stable.  No new concerns or complaints 3.  Hypertension-  Elevated systolic reading, Lotensin will be Resound.  Blood pressure will be reassessed next visit.

## 2010-06-15 NOTE — Telephone Encounter (Signed)
Please advise - I only see Benazepril HCL 20mg . In the med list.

## 2010-06-15 NOTE — Patient Instructions (Signed)
Return in 3 months for follow-up  Take 81 mg of aspirin daily  Check your blood sugars regularly.  If blood sugars are consistently below 70 or above 200 and please contact the office.  Limit your sodium (Salt) intake  Take a calcium supplement, plus [phone] units of vitamin D

## 2010-06-15 NOTE — Telephone Encounter (Signed)
Okaying to call in the combination product with hydrochlorothiazide  #90  RF 2

## 2010-06-15 NOTE — Telephone Encounter (Signed)
Pts son called and said that pt needs new script for Benazep/HCTZ 20-12.5mg . Old script has expired. Pls call in to [facility] on [address] [phone]. Dr Amador Cunas told pt to restart this med.

## 2010-06-16 MED ORDER — BENAZEPRIL-HYDROCHLOROTHIAZIDE 20-12.5 MG PO TABS: 1.0000 | ORAL_TABLET | Freq: Every day | ORAL | Status: DC

## 2010-06-16 NOTE — Telephone Encounter (Signed)
Changed in med list - called to [facility]. KIK

## 2010-08-31 ENCOUNTER — Other Ambulatory Visit: Payer: Self-pay | Admitting: Internal Medicine

## 2010-09-14 ENCOUNTER — Encounter: Payer: Self-pay | Admitting: Internal Medicine

## 2010-09-14 ENCOUNTER — Ambulatory Visit (INDEPENDENT_AMBULATORY_CARE_PROVIDER_SITE_OTHER): Payer: Medicare Other | Admitting: Internal Medicine

## 2010-09-14 DIAGNOSIS — E119 Type 2 diabetes mellitus without complications: Secondary | ICD-10-CM

## 2010-09-14 DIAGNOSIS — M1990 Unspecified osteoarthritis, unspecified site: Secondary | ICD-10-CM

## 2010-09-14 DIAGNOSIS — I10 Essential (primary) hypertension: Secondary | ICD-10-CM

## 2010-09-14 LAB — HEMOGLOBIN A1C: Hgb A1c MFr Bld: 6.5 % (ref 4.6–6.5)

## 2010-09-14 MED ORDER — LORAZEPAM 0.5 MG PO TABS: 0.5000 mg | ORAL_TABLET | Freq: Three times a day (TID) | ORAL | Status: DC | PRN

## 2010-09-14 MED ORDER — BENAZEPRIL HCL 20 MG PO TABS: 20.0000 mg | ORAL_TABLET | Freq: Every day | ORAL | Status: DC

## 2010-09-14 MED ORDER — METFORMIN HCL ER 500 MG PO TB24: 500.0000 mg | ORAL_TABLET | Freq: Every day | ORAL | Status: DC

## 2010-09-14 MED ORDER — VERAPAMIL HCL 240 MG PO TBCR: 240.0000 mg | EXTENDED_RELEASE_TABLET | Freq: Every day | ORAL | Status: DC

## 2010-09-14 NOTE — Progress Notes (Signed)
  Subjective:    Patient ID: Margaretha Sheffield, female    DOB: 1927-09-22, 77 y.o.   MRN: [id number]  HPI   77 year old patient who is seen today for followup of her type 2 diabetes she is asymptomatic. She has hypertension osteoarthritis and is really doing quite well. Blood pressure today is in a normal range.  Hemoglobin A1c is although trending up throughout the past year remain less than 7;  she remains on metformin 500 mg daily.    Review of Systems  Constitutional: Negative.   HENT: Negative for hearing loss, congestion, sore throat, rhinorrhea, dental problem, sinus pressure and tinnitus.   Eyes: Negative for pain, discharge and visual disturbance.  Respiratory: Negative for cough and shortness of breath.   Cardiovascular: Negative for chest pain, palpitations and leg swelling.  Gastrointestinal: Negative for nausea, vomiting, abdominal pain, diarrhea, constipation, blood in stool and abdominal distention.  Genitourinary: Negative for dysuria, urgency, frequency, hematuria, flank pain, vaginal bleeding, vaginal discharge, difficulty urinating, vaginal pain and pelvic pain.  Musculoskeletal: Negative for joint swelling, arthralgias and gait problem.  Skin: Negative for rash.  Neurological: Negative for dizziness, syncope, speech difficulty, weakness, numbness and headaches.  Hematological: Negative for adenopathy.  Psychiatric/Behavioral: Negative for behavioral problems, dysphoric mood and agitation. The patient is not nervous/anxious.        Objective:   Physical Exam  Constitutional: She is oriented to person, place, and time. She appears well-developed and well-nourished.  HENT:  Head: Normocephalic.  Right Ear: External ear normal.  Left Ear: External ear normal.  Mouth/Throat: Oropharynx is clear and moist.  Eyes: Conjunctivae and EOM are normal. Pupils are equal, round, and reactive to light.  Neck: Normal range of motion. Neck supple. No thyromegaly present.    Cardiovascular: Normal rate, regular rhythm, normal heart sounds and intact distal pulses.   Pulmonary/Chest: Effort normal and breath sounds normal.  Abdominal: Soft. Bowel sounds are normal. She exhibits no mass. There is no tenderness.  Musculoskeletal: Normal range of motion.  Lymphadenopathy:    She has no cervical adenopathy.  Neurological: She is alert and oriented to person, place, and time.  Skin: Skin is warm and dry. No rash noted.  Psychiatric: She has a normal mood and affect. Her behavior is normal.          Assessment & Plan:    Diabetes mellitus. We'll check a hemoglobin A1c. Continue her present medical regimen Hypertension stable; all  medications refilled

## 2010-09-14 NOTE — Patient Instructions (Signed)
Please check your hemoglobin A1c every 3 months  Limit your sodium (Salt) intake    It is important that you exercise regularly, at least 20 minutes 3 to 4 times per week.  If you develop chest pain or shortness of breath seek  medical attention.   

## 2010-09-26 NOTE — Assessment & Plan Note (Signed)
[facility] OFFICE NOTE   ICY, FUHRMANN                     MRN:          [id number]  DATE:10/04/2006                            DOB:          02/26/1928    A 77 year old female seen today for an annual exam.  She has  hypertension and type 2 diabetes.  She has done quite well.  Blood sugar  has remained stable.  No other concerns or complaints today.   PAST MEDICAL HISTORY:  Fairly unremarkable.  She is a gravida 2, para 2,  abortus 0.  Was hospitalized for a left hip fracture, otherwise no  admissions.   REVIEW OF SYSTEMS:  Negative.  Unclear whether she has ever had any  mammograms.  States that she had what sounds like a flexible  sigmoidoscopy in [place] a number of years ago.  No colonoscopies.   FAMILY HISTORY:  Father died of lung cancer at 65.  Mother died of  complications of diabetes at 68.  Two brothers.  One died of either  pancreatitis or pancreatic cancer at 19, another with COPD.  One sister  died of a stroke at 28.   PHYSICAL EXAMINATION:  GENERAL:  A well-developed, mildly overweight  female in no acute distress.  VITAL SIGNS:  Blood pressure 120/80.  HEENT:  Fundi, ears, nose and throat clear.  Upper dentures in place.  NECK:  No bruits.  CHEST:  Clear except for a few crackles in the left base.  BREASTS:  Negative.  CARDIOVASCULAR:  Normal heart sounds, no murmurs.  ABDOMEN:  Soft and nontender.  No organomegaly.  Did have umbilical  hernia.  PELVIC:  Unremarkable.  No adnexal masses.  RECTAL:  Stool heme-negative.  EXTREMITIES:  Full peripheral pulses.  NEUROLOGIC:  Negative.  Intact to monofilament testing and vibration.   IMPRESSION:  1. Diabetes.  2. Hypertension.  3. Menopausal syndrome.  4. History of anxiety disorder.  5. Degenerative joint disease.  6. Status post left hip fracture.   DISPOSITION:  Laboratory update will be reviewed.  Medical regimen  unchanged.  Samples and prescriptions provided.  Will recheck in 3  months.     Gordy Savers, MD  Electronically Signed    PFK/MedQ  DD: 10/04/2006  DT: 10/04/2006  Job #: [id number]

## 2010-12-18 ENCOUNTER — Encounter: Payer: Medicare Other | Admitting: Internal Medicine

## 2011-01-03 ENCOUNTER — Encounter: Payer: Medicare Other | Admitting: Internal Medicine

## 2011-01-10 ENCOUNTER — Ambulatory Visit (INDEPENDENT_AMBULATORY_CARE_PROVIDER_SITE_OTHER): Payer: Medicare Other | Admitting: Internal Medicine

## 2011-01-10 ENCOUNTER — Encounter: Payer: Self-pay | Admitting: Internal Medicine

## 2011-01-10 DIAGNOSIS — Z0000 Encounter for general adult medical examination without abnormal findings: Secondary | ICD-10-CM

## 2011-01-10 DIAGNOSIS — Z23 Encounter for immunization: Secondary | ICD-10-CM

## 2011-01-10 DIAGNOSIS — E119 Type 2 diabetes mellitus without complications: Secondary | ICD-10-CM

## 2011-01-10 DIAGNOSIS — I10 Essential (primary) hypertension: Secondary | ICD-10-CM

## 2011-01-10 DIAGNOSIS — J069 Acute upper respiratory infection, unspecified: Secondary | ICD-10-CM

## 2011-01-10 DIAGNOSIS — M1990 Unspecified osteoarthritis, unspecified site: Secondary | ICD-10-CM

## 2011-01-10 LAB — LIPID PANEL
Cholesterol: 185 mg/dL (ref 0–200)
HDL: 51.1 mg/dL (ref >39.00)
LDL Cholesterol: 119 mg/dL — ABNORMAL HIGH (ref 0–99)
VLDL: 14.6 mg/dL (ref 0.0–40.0)

## 2011-01-10 LAB — CBC WITH DIFFERENTIAL/PLATELET
Basophils Absolute: 0 10*3/uL (ref 0.0–0.1)
Basophils Relative: 0.3 % (ref 0.0–3.0)
Eosinophils Absolute: 0.2 10*3/uL (ref 0.0–0.7)
Eosinophils Relative: 2.4 % (ref 0.0–5.0)
HCT: 36.1 % (ref 36.0–46.0)
Hemoglobin: 11.9 g/dL — ABNORMAL LOW (ref 12.0–15.0)
Lymphocytes Relative: 23.6 % (ref 12.0–46.0)
Lymphs Abs: 1.8 10*3/uL (ref 0.7–4.0)
MCHC: 32.9 g/dL (ref 30.0–36.0)
MCV: 90.7 fl (ref 78.0–100.0)
Monocytes Absolute: 0.6 10*3/uL (ref 0.1–1.0)
Monocytes Relative: 7.5 % (ref 3.0–12.0)
Neutro Abs: 5 10*3/uL (ref 1.4–7.7)
Neutrophils Relative %: 66.2 % (ref 43.0–77.0)
Platelets: 272 10*3/uL (ref 150.0–400.0)
RBC: 3.98 Mil/uL (ref 3.87–5.11)
RDW: 14.1 % (ref 11.5–14.6)
WBC: 7.5 10*3/uL (ref 4.5–10.5)

## 2011-01-10 LAB — BASIC METABOLIC PANEL WITH GFR
BUN: 17 mg/dL (ref 6–23)
CO2: 26 meq/L (ref 19–32)
Calcium: 10 mg/dL (ref 8.4–10.5)
Chloride: 108 meq/L (ref 96–112)
Creatinine, Ser: 0.7 mg/dL (ref 0.4–1.2)
GFR: 80.04 mL/min
Glucose, Bld: 124 mg/dL — ABNORMAL HIGH (ref 70–99)
Potassium: 4.4 meq/L (ref 3.5–5.1)
Sodium: 141 meq/L (ref 135–145)

## 2011-01-10 LAB — HEPATIC FUNCTION PANEL
ALT: 14 U/L (ref 0–35)
AST: 21 U/L (ref 0–37)
Albumin: 4 g/dL (ref 3.5–5.2)
Alkaline Phosphatase: 55 U/L (ref 39–117)
Bilirubin, Direct: 0.1 mg/dL (ref 0.0–0.3)
Total Bilirubin: 0.5 mg/dL (ref 0.3–1.2)
Total Protein: 6.8 g/dL (ref 6.0–8.3)

## 2011-01-10 LAB — TSH: TSH: 1.82 u[IU]/mL (ref 0.35–5.50)

## 2011-01-10 LAB — HEMOGLOBIN A1C: Hgb A1c MFr Bld: 6.5 % (ref 4.6–6.5)

## 2011-01-10 MED ORDER — METFORMIN HCL ER 500 MG PO TB24: 500.0000 mg | ORAL_TABLET | Freq: Every day | ORAL | Status: DC

## 2011-01-10 MED ORDER — LORAZEPAM 0.5 MG PO TABS: 0.5000 mg | ORAL_TABLET | Freq: Three times a day (TID) | ORAL | Status: DC | PRN

## 2011-01-10 MED ORDER — BENAZEPRIL HCL 20 MG PO TABS: 20.0000 mg | ORAL_TABLET | Freq: Every day | ORAL | Status: DC

## 2011-01-10 MED ORDER — VERAPAMIL HCL 240 MG PO TBCR: 240.0000 mg | EXTENDED_RELEASE_TABLET | Freq: Every day | ORAL | Status: DC

## 2011-01-10 NOTE — Progress Notes (Signed)
  Subjective:    Patient ID: Kathy Beltran, female    DOB: [birth year], 77 y.o.   MRN: [id number]  HPI  CC: CPX and no c/o BS running 120's.  History of Present Illness:   77 year old patient who is seen today for a comprehensive evaluation. Medical problems include hypertension, diabetes, and anxiety disorder.  She remains on triple therapy for blood pressure control. She also has a history of osteoarthritis, which has been stable.   She has a history of type 2 diabetes which has been quite stable. Her last hemoglobin A1c 6.5. She remains controlled on metformin therapy only  Allergies:  No Known Drug Allergies   Past History:  Past Medical History:  Reviewed history from 12/19/2006 and no changes required.  Anxiety  Diabetes mellitus, type II  Hypertension  Osteoarthritis   Past Surgical History:  Hip fracture ORIF  colonoscopy-none (declines)   Family History:  Reviewed history from 04/15/2007 and no changes required.  father died age 63, lung cancer  mother died age 73, because of the diabetes also, status post nephrectomy for renal cancer  One brother died age 40 pancreatic cancer  one sister, history of COPD, status post CVA deceased age 11  one brother, history of COPD ,  H/o MI (age 55)  Social History:  Reviewed history from 05/18/2008 and no changes required.  recently lost her daughter-in-law to illness  husband status post recent stroke   1. Risk factors, based on past  M,S,F history-  cardiovascular risk factors include diabetes hypertension and family history of coronary artery disease  2.  Physical activities: Fairly active without exercise limitations no rigorous regular exercise program  3.  Depression/mood: No history of depression has a history of chronic anxiety controlled with lorazepam  4.  Hearing: Minor impairment following  5.  ADL's: Independent in all aspects of daily living 6.  Fall risk: Low  7.  Home safety: No problems identified 8.   Height weight, and visual acuity; height and weight stable no change in visual acuity. No eye exam in approximately 2 years 9.  Counseling: eye exam recommended. Regular exercise restricted sodium diet all encouraged  10. Lab orders based on risk factors: Laboratory profile including lipid panel and hemoglobin A1c will be reviewed 11. Referral: Not appropriate at this time  12. Care plan: Heart healthy diet regular exercise hemoglobin A1c is every 3 months encouraged  13. Cognitive assessment: Alert and oriented with normal affect. No cognitive dysfunction.     Review of Systems     Objective:   Physical Exam        Assessment & Plan:

## 2011-01-10 NOTE — Patient Instructions (Signed)
Limit your sodium (Salt) intake   Please check your hemoglobin A1c every 3 months   

## 2011-04-17 ENCOUNTER — Encounter: Payer: Self-pay | Admitting: Internal Medicine

## 2011-04-17 ENCOUNTER — Ambulatory Visit (INDEPENDENT_AMBULATORY_CARE_PROVIDER_SITE_OTHER): Payer: Medicare Other | Admitting: Internal Medicine

## 2011-04-17 DIAGNOSIS — E119 Type 2 diabetes mellitus without complications: Secondary | ICD-10-CM

## 2011-04-17 DIAGNOSIS — I10 Essential (primary) hypertension: Secondary | ICD-10-CM

## 2011-04-17 DIAGNOSIS — M1990 Unspecified osteoarthritis, unspecified site: Secondary | ICD-10-CM

## 2011-04-17 LAB — HEMOGLOBIN A1C: Hgb A1c MFr Bld: 6.5 % (ref 4.6–6.5)

## 2011-04-17 MED ORDER — LORAZEPAM 0.5 MG PO TABS: 0.5000 mg | ORAL_TABLET | Freq: Three times a day (TID) | ORAL | Status: DC | PRN

## 2011-04-17 MED ORDER — VERAPAMIL HCL 240 MG PO TBCR: 240.0000 mg | EXTENDED_RELEASE_TABLET | Freq: Every day | ORAL | Status: DC

## 2011-04-17 MED ORDER — BENAZEPRIL HCL 20 MG PO TABS: 20.0000 mg | ORAL_TABLET | Freq: Every day | ORAL | Status: DC

## 2011-04-17 MED ORDER — METFORMIN HCL ER 500 MG PO TB24: 500.0000 mg | ORAL_TABLET | Freq: Every day | ORAL | Status: DC

## 2011-04-17 NOTE — Patient Instructions (Signed)
Please check your blood pressure on a regular basis.  If it is consistently greater than 150/90, please make an office appointment.   Please check your hemoglobin A1c every 3 months    It is important that you exercise regularly, at least 20 minutes 3 to 4 times per week.  If you develop chest pain or shortness of breath seek  medical attention.  Limit your sodium (Salt) intake

## 2011-04-17 NOTE — Progress Notes (Signed)
  Subjective:    Patient ID: Kathy Beltran, female    DOB: [birth year], 77 y.o.   MRN: [id number]  HPI  77 year old patient who is seen today for followup of her type 2 diabetes. Hemoglobin A1c's have been nicely controlled. She has a history of anxiety disorder which has been controlled well on when necessary lorazepam. She has treated hypertension and osteoarthritis. No real complaints of arthritis today. Her blood pressure remains nicely controlled. She denies any cardiopulmonary complaints    Review of Systems  Constitutional: Negative.   HENT: Negative for hearing loss, congestion, sore throat, rhinorrhea, dental problem, sinus pressure and tinnitus.   Eyes: Negative for pain, discharge and visual disturbance.  Respiratory: Negative for cough and shortness of breath.   Cardiovascular: Negative for chest pain, palpitations and leg swelling.  Gastrointestinal: Negative for nausea, vomiting, abdominal pain, diarrhea, constipation, blood in stool and abdominal distention.  Genitourinary: Negative for dysuria, urgency, frequency, hematuria, flank pain, vaginal bleeding, vaginal discharge, difficulty urinating, vaginal pain and pelvic pain.  Musculoskeletal: Negative for joint swelling, arthralgias and gait problem.  Skin: Negative for rash.  Neurological: Negative for dizziness, syncope, speech difficulty, weakness, numbness and headaches.  Hematological: Negative for adenopathy.  Psychiatric/Behavioral: Negative for behavioral problems, dysphoric mood and agitation. The patient is not nervous/anxious.        Objective:   Physical Exam  Constitutional: She is oriented to person, place, and time. She appears well-developed and well-nourished.  HENT:  Head: Normocephalic.  Right Ear: External ear normal.  Left Ear: External ear normal.  Mouth/Throat: Oropharynx is clear and moist.  Eyes: Conjunctivae and EOM are normal. Pupils are equal, round, and reactive to light.  Neck: Normal  range of motion. Neck supple. No thyromegaly present.  Cardiovascular: Normal rate, regular rhythm, normal heart sounds and intact distal pulses.   Pulmonary/Chest: Effort normal and breath sounds normal.  Abdominal: Soft. Bowel sounds are normal. She exhibits no mass. There is no tenderness.  Musculoskeletal: Normal range of motion.  Lymphadenopathy:    She has no cervical adenopathy.  Neurological: She is alert and oriented to person, place, and time.  Skin: Skin is warm and dry. No rash noted.  Psychiatric: She has a normal mood and affect. Her behavior is normal.          Assessment & Plan:    Diabetes mellitus. Well controlled we'll check a hemoglobin A1c and continue metformin Hypertension well controlled. We'll continue combination therapy Osteoarthritis stable Anxiety disorder stable. Medications refilled  Recheck in 3 months

## 2011-07-17 ENCOUNTER — Ambulatory Visit: Payer: Medicare Other | Admitting: Internal Medicine

## 2011-07-17 ENCOUNTER — Encounter: Payer: Self-pay | Admitting: Internal Medicine

## 2011-07-17 ENCOUNTER — Ambulatory Visit (INDEPENDENT_AMBULATORY_CARE_PROVIDER_SITE_OTHER): Payer: Medicare Other | Admitting: Internal Medicine

## 2011-07-17 DIAGNOSIS — I10 Essential (primary) hypertension: Secondary | ICD-10-CM

## 2011-07-17 DIAGNOSIS — F411 Generalized anxiety disorder: Secondary | ICD-10-CM

## 2011-07-17 DIAGNOSIS — E119 Type 2 diabetes mellitus without complications: Secondary | ICD-10-CM

## 2011-07-17 MED ORDER — LORAZEPAM 0.5 MG PO TABS: 0.5000 mg | ORAL_TABLET | Freq: Three times a day (TID) | ORAL | Status: DC | PRN

## 2011-07-17 NOTE — Progress Notes (Signed)
  Subjective:    Patient ID: Kathy Beltran, female    DOB: 1927/10/27, 78 y.o.   MRN: [id number]  HPI  Wt Readings from Last 3 Encounters:  07/17/11 135 lb (61.236 kg)  04/17/11 139 lb (63.05 kg)  01/10/11 137 lb (62.38 kg)   78 year old patient who is in today for followup of her type 2 diabetes and hypertension. She continues to do quite well. She is maintained Blood pressure and glycemic control. No cardiopulmonary complaints. She has been compliant with her medication  Review of Systems  Constitutional: Negative.   HENT: Negative for hearing loss, congestion, sore throat, rhinorrhea, dental problem, sinus pressure and tinnitus.   Eyes: Negative for pain, discharge and visual disturbance.  Respiratory: Negative for cough and shortness of breath.   Cardiovascular: Negative for chest pain, palpitations and leg swelling.  Gastrointestinal: Negative for nausea, vomiting, abdominal pain, diarrhea, constipation, blood in stool and abdominal distention.  Genitourinary: Negative for dysuria, urgency, frequency, hematuria, flank pain, vaginal bleeding, vaginal discharge, difficulty urinating, vaginal pain and pelvic pain.  Musculoskeletal: Negative for joint swelling, arthralgias and gait problem.  Skin: Negative for rash.  Neurological: Negative for dizziness, syncope, speech difficulty, weakness, numbness and headaches.  Hematological: Negative for adenopathy.  Psychiatric/Behavioral: Negative for behavioral problems, dysphoric mood and agitation. The patient is not nervous/anxious.        Objective:   Physical Exam  Constitutional: She is oriented to person, place, and time. She appears well-developed and well-nourished.  HENT:  Head: Normocephalic.  Right Ear: External ear normal.  Left Ear: External ear normal.  Mouth/Throat: Oropharynx is clear and moist.  Eyes: Conjunctivae and EOM are normal. Pupils are equal, round, and reactive to light.  Neck: Normal range of motion. Neck  supple. No thyromegaly present.  Cardiovascular: Normal rate, regular rhythm, normal heart sounds and intact distal pulses.   Pulmonary/Chest: Effort normal and breath sounds normal.  Abdominal: Soft. Bowel sounds are normal. She exhibits no mass. There is no tenderness.  Musculoskeletal: Normal range of motion.  Lymphadenopathy:    She has no cervical adenopathy.  Neurological: She is alert and oriented to person, place, and time.  Skin: Skin is warm and dry. No rash noted.  Psychiatric: She has a normal mood and affect. Her behavior is normal.          Assessment & Plan:   Diabetes mellitus type 2 well controlled. We'll check a hemoglobin A1c Hypertension nicely controlled. We'll continue present regimen   Recheck 3 months

## 2011-07-17 NOTE — Patient Instructions (Signed)
Please check your hemoglobin A1c every 3 months    It is important that you exercise regularly, at least 20 minutes 3 to 4 times per week.  If you develop chest pain or shortness of breath seek  medical attention.  Limit your sodium (Salt) intake   

## 2011-08-09 ENCOUNTER — Other Ambulatory Visit: Payer: Self-pay | Admitting: Internal Medicine

## 2011-10-16 ENCOUNTER — Encounter: Payer: Self-pay | Admitting: Internal Medicine

## 2011-10-16 ENCOUNTER — Ambulatory Visit (INDEPENDENT_AMBULATORY_CARE_PROVIDER_SITE_OTHER): Payer: Medicare Other | Admitting: Internal Medicine

## 2011-10-16 VITALS — BP 124/70 | Temp 97.8°F | Wt 136.0 lb

## 2011-10-16 DIAGNOSIS — I10 Essential (primary) hypertension: Secondary | ICD-10-CM

## 2011-10-16 DIAGNOSIS — E119 Type 2 diabetes mellitus without complications: Secondary | ICD-10-CM

## 2011-10-16 MED ORDER — BENAZEPRIL HCL 20 MG PO TABS: 20.0000 mg | ORAL_TABLET | Freq: Every day | ORAL | Status: DC

## 2011-10-16 MED ORDER — VERAPAMIL HCL ER 240 MG PO TBCR: 240.0000 mg | EXTENDED_RELEASE_TABLET | Freq: Every day | ORAL | Status: DC

## 2011-10-16 MED ORDER — LORAZEPAM 0.5 MG PO TABS: 0.5000 mg | ORAL_TABLET | Freq: Three times a day (TID) | ORAL | Status: DC | PRN

## 2011-10-16 MED ORDER — METFORMIN HCL ER 500 MG PO TB24: 500.0000 mg | ORAL_TABLET | Freq: Every day | ORAL | Status: DC

## 2011-10-16 NOTE — Patient Instructions (Signed)
Please check your hemoglobin A1c every 3 months  Limit your sodium (Salt) intake    It is important that you exercise regularly, at least 20 minutes 3 to 4 times per week.  If you develop chest pain or shortness of breath seek  medical attention.   

## 2011-10-16 NOTE — Progress Notes (Signed)
  Subjective:    Patient ID: Kathy Beltran, female    DOB: August 08, 1927, 78 y.o.   MRN: [id number]  HPI  78 year old patient who is seen today for followup of type 2 diabetes as well as hypertension. She does quite well. Her hemoglobin A1c is over the past year have been in a nondiabetic range. Her blood pressures also been well-controlled she has mild arthritis. No new concerns or complaints    Review of Systems  Constitutional: Negative.   HENT: Negative for hearing loss, congestion, sore throat, rhinorrhea, dental problem, sinus pressure and tinnitus.   Eyes: Negative for pain, discharge and visual disturbance.  Respiratory: Negative for cough and shortness of breath.   Cardiovascular: Negative for chest pain, palpitations and leg swelling.  Gastrointestinal: Negative for nausea, vomiting, abdominal pain, diarrhea, constipation, blood in stool and abdominal distention.  Genitourinary: Negative for dysuria, urgency, frequency, hematuria, flank pain, vaginal bleeding, vaginal discharge, difficulty urinating, vaginal pain and pelvic pain.  Musculoskeletal: Positive for back pain and arthralgias. Negative for joint swelling and gait problem.  Skin: Negative for rash.  Neurological: Negative for dizziness, syncope, speech difficulty, weakness, numbness and headaches.  Hematological: Negative for adenopathy.  Psychiatric/Behavioral: Negative for behavioral problems, dysphoric mood and agitation. The patient is not nervous/anxious.        Objective:   Physical Exam  Constitutional: She is oriented to person, place, and time. She appears well-developed and well-nourished.  HENT:  Head: Normocephalic.  Right Ear: External ear normal.  Left Ear: External ear normal.  Mouth/Throat: Oropharynx is clear and moist.  Eyes: Conjunctivae and EOM are normal. Pupils are equal, round, and reactive to light.  Neck: Normal range of motion. Neck supple. No thyromegaly present.  Cardiovascular: Normal  rate, regular rhythm, normal heart sounds and intact distal pulses.   Pulmonary/Chest: Effort normal and breath sounds normal.  Abdominal: Soft. Bowel sounds are normal. She exhibits no mass. There is no tenderness.  Musculoskeletal: Normal range of motion.  Lymphadenopathy:    She has no cervical adenopathy.  Neurological: She is alert and oriented to person, place, and time.  Skin: Skin is warm and dry. No rash noted.  Psychiatric: She has a normal mood and affect. Her behavior is normal.          Assessment & Plan:    Diabetes mellitus. Well controlled. We'll continue present regimen and check a hemoglobin A1c in 3 months Hypertension well controlled we'll continue present regimen  All medications refilled

## 2012-01-15 ENCOUNTER — Encounter: Payer: Self-pay | Admitting: Internal Medicine

## 2012-01-15 ENCOUNTER — Ambulatory Visit (INDEPENDENT_AMBULATORY_CARE_PROVIDER_SITE_OTHER): Payer: Medicare Other | Admitting: Internal Medicine

## 2012-01-15 VITALS — BP 132/80 | Temp 98.0°F | Wt 140.0 lb

## 2012-01-15 DIAGNOSIS — F411 Generalized anxiety disorder: Secondary | ICD-10-CM

## 2012-01-15 DIAGNOSIS — E119 Type 2 diabetes mellitus without complications: Secondary | ICD-10-CM

## 2012-01-15 DIAGNOSIS — I10 Essential (primary) hypertension: Secondary | ICD-10-CM

## 2012-01-15 MED ORDER — VERAPAMIL HCL ER 240 MG PO TBCR: 240.0000 mg | EXTENDED_RELEASE_TABLET | Freq: Every day | ORAL | Status: DC

## 2012-01-15 MED ORDER — METFORMIN HCL ER 500 MG PO TB24: 500.0000 mg | ORAL_TABLET | Freq: Every day | ORAL | Status: DC

## 2012-01-15 MED ORDER — LORAZEPAM 0.5 MG PO TABS: 0.5000 mg | ORAL_TABLET | Freq: Three times a day (TID) | ORAL | Status: DC | PRN

## 2012-01-15 MED ORDER — BENAZEPRIL HCL 20 MG PO TABS: 20.0000 mg | ORAL_TABLET | Freq: Every day | ORAL | Status: DC

## 2012-01-15 NOTE — Patient Instructions (Signed)
Limit your sodium (Salt) intake    It is important that you exercise regularly, at least 20 minutes 3 to 4 times per week.  If you develop chest pain or shortness of breath seek  medical attention.  Return in 3 months for follow-up  

## 2012-01-15 NOTE — Progress Notes (Signed)
Subjective:    Patient ID: Kathy Beltran, female    DOB: 11-26-1927, 78 y.o.   MRN: [id number]  HPI  78 year old patient who is seen today for followup of type 2 diabetes. She is maintain very nice glycemic control on metformin therapy alone. She has a history of treated hypertension and mild anxiety disorder. She has done quite well over the past 3 months. Hemoglobin A1c is has generally been 6.5 or less. No concerns or complaints.  Past Medical History  Diagnosis Date  . DIABETES MELLITUS, TYPE II 12/19/2006  . ANXIETY 12/19/2006  . HYPERTENSION 12/19/2006  . OSTEOARTHRITIS 12/19/2006    History   Social History  . Marital Status: Married    Spouse Name: N/A    Number of Children: N/A  . Years of Education: N/A   Occupational History  . Not on file.   Social History Main Topics  . Smoking status: Former Games developer  . Smokeless tobacco: Never Used  . Alcohol Use: No  . Drug Use: No  . Sexually Active: Not on file   Other Topics Concern  . Not on file   Social History Narrative  . No narrative on file    Past Surgical History  Procedure Date  . Hip open reduction     hip fx    Family History  Problem Relation Age of Onset  . Cancer Mother     renal ca - status post nephrectomy  . Diabetes Mother   . Cancer Sister     died of pancreatic ca  . COPD Sister   . Stroke Sister   . Cancer Brother     died of pancreatic ca  . COPD Brother     No Known Allergies  Current Outpatient Prescriptions on File Prior to Visit  Medication Sig Dispense Refill  . benazepril (LOTENSIN) 20 MG tablet Take 1 tablet (20 mg total) by mouth daily.  90 tablet  6  . LORazepam (ATIVAN) 0.5 MG tablet Take 1 tablet (0.5 mg total) by mouth every 8 (eight) hours as needed for anxiety.  90 tablet  0  . metFORMIN (GLUCOPHAGE-XR) 500 MG 24 hr tablet Take 1 tablet (500 mg total) by mouth daily with breakfast.  90 tablet  6  . verapamil (CALAN-SR) 240 MG CR tablet Take 1 tablet (240 mg total) by  mouth at bedtime.  90 tablet  6    BP 132/80  Temp 98 F (36.7 C) (Oral)  Wt 140 lb (63.504 kg)      Review of Systems  Constitutional: Negative.   HENT: Negative for hearing loss, congestion, sore throat, rhinorrhea, dental problem, sinus pressure and tinnitus.   Eyes: Negative for pain, discharge and visual disturbance.  Respiratory: Negative for cough and shortness of breath.   Cardiovascular: Negative for chest pain, palpitations and leg swelling.  Gastrointestinal: Negative for nausea, vomiting, abdominal pain, diarrhea, constipation, blood in stool and abdominal distention.  Genitourinary: Negative for dysuria, urgency, frequency, hematuria, flank pain, vaginal bleeding, vaginal discharge, difficulty urinating, vaginal pain and pelvic pain.  Musculoskeletal: Negative for joint swelling, arthralgias and gait problem.  Skin: Negative for rash.  Neurological: Negative for dizziness, syncope, speech difficulty, weakness, numbness and headaches.  Hematological: Negative for adenopathy.  Psychiatric/Behavioral: Negative for behavioral problems, dysphoric mood and agitation. The patient is nervous/anxious.        Objective:   Physical Exam  Constitutional: She is oriented to person, place, and time. She appears well-developed and well-nourished.  HENT:  Head: Normocephalic.  Right Ear: External ear normal.  Left Ear: External ear normal.  Mouth/Throat: Oropharynx is clear and moist.  Eyes: Conjunctivae and EOM are normal. Pupils are equal, round, and reactive to light.  Neck: Normal range of motion. Neck supple. No thyromegaly present.  Cardiovascular: Normal rate, regular rhythm and normal heart sounds.   Pulmonary/Chest: Effort normal and breath sounds normal.  Abdominal: Soft. Bowel sounds are normal. She exhibits no mass. There is no tenderness.  Musculoskeletal: Normal range of motion.  Lymphadenopathy:    She has no cervical adenopathy.  Neurological: She is alert and  oriented to person, place, and time.  Skin: Skin is warm and dry. No rash noted.  Psychiatric: She has a normal mood and affect. Her behavior is normal.          Assessment & Plan:     Diabetes mellitus type 2. Will check a hemoglobin A1c continue metformin low-dose Hypertension well controlled. We'll continue Lotensin and verapamil  Recheck 3 months

## 2012-02-01 ENCOUNTER — Ambulatory Visit (INDEPENDENT_AMBULATORY_CARE_PROVIDER_SITE_OTHER): Payer: Medicare Other

## 2012-02-01 ENCOUNTER — Other Ambulatory Visit: Payer: Self-pay | Admitting: Internal Medicine

## 2012-02-01 DIAGNOSIS — Z23 Encounter for immunization: Secondary | ICD-10-CM

## 2012-04-22 ENCOUNTER — Ambulatory Visit (INDEPENDENT_AMBULATORY_CARE_PROVIDER_SITE_OTHER): Payer: Medicare Other | Admitting: Internal Medicine

## 2012-04-22 ENCOUNTER — Encounter: Payer: Self-pay | Admitting: Internal Medicine

## 2012-04-22 VITALS — BP 140/74 | HR 78 | Temp 97.6°F | Resp 18 | Ht 63.5 in | Wt 137.0 lb

## 2012-04-22 DIAGNOSIS — E119 Type 2 diabetes mellitus without complications: Secondary | ICD-10-CM

## 2012-04-22 DIAGNOSIS — F411 Generalized anxiety disorder: Secondary | ICD-10-CM

## 2012-04-22 DIAGNOSIS — Z0000 Encounter for general adult medical examination without abnormal findings: Secondary | ICD-10-CM

## 2012-04-22 DIAGNOSIS — I10 Essential (primary) hypertension: Secondary | ICD-10-CM

## 2012-04-22 LAB — CBC WITH DIFFERENTIAL/PLATELET
Basophils Relative: 0.4 % (ref 0.0–3.0)
Eosinophils Relative: 3.3 % (ref 0.0–5.0)
HCT: 36.4 % (ref 36.0–46.0)
Lymphs Abs: 2.1 10*3/uL (ref 0.7–4.0)
MCV: 88.5 fl (ref 78.0–100.0)
Monocytes Absolute: 0.7 10*3/uL (ref 0.1–1.0)
Monocytes Relative: 9 % (ref 3.0–12.0)
Neutrophils Relative %: 60.3 % (ref 43.0–77.0)
RBC: 4.12 Mil/uL (ref 3.87–5.11)
WBC: 7.7 10*3/uL (ref 4.5–10.5)

## 2012-04-22 LAB — MICROALBUMIN / CREATININE URINE RATIO: Microalb Creat Ratio: 21.2 mg/g (ref 0.0–30.0)

## 2012-04-22 LAB — LIPID PANEL
Cholesterol: 192 mg/dL (ref 0–200)
LDL Cholesterol: 129 mg/dL — ABNORMAL HIGH (ref 0–99)
Triglycerides: 90 mg/dL (ref 0.0–149.0)

## 2012-04-22 LAB — COMPREHENSIVE METABOLIC PANEL
AST: 19 U/L (ref 0–37)
BUN: 18 mg/dL (ref 6–23)
Calcium: 10.6 mg/dL — ABNORMAL HIGH (ref 8.4–10.5)
Chloride: 103 mEq/L (ref 96–112)
Creatinine, Ser: 0.7 mg/dL (ref 0.4–1.2)
GFR: 86.49 mL/min (ref >60.00)

## 2012-04-22 MED ORDER — BENAZEPRIL HCL 20 MG PO TABS: 20.0000 mg | ORAL_TABLET | Freq: Every day | ORAL | Status: DC

## 2012-04-22 MED ORDER — METFORMIN HCL ER 500 MG PO TB24: 500.0000 mg | ORAL_TABLET | Freq: Every day | ORAL | Status: DC

## 2012-04-22 MED ORDER — VERAPAMIL HCL ER 240 MG PO TBCR: 240.0000 mg | EXTENDED_RELEASE_TABLET | Freq: Every day | ORAL | Status: DC

## 2012-04-22 MED ORDER — LORAZEPAM 0.5 MG PO TABS: 0.5000 mg | ORAL_TABLET | Freq: Three times a day (TID) | ORAL | Status: DC | PRN

## 2012-04-22 NOTE — Patient Instructions (Signed)
Limit your sodium (Salt) intake   Please check your hemoglobin A1c every 3 months    It is important that you exercise regularly, at least 20 minutes 3 to 4 times per week.  If you develop chest pain or shortness of breath seek  medical attention.   

## 2012-04-22 NOTE — Progress Notes (Signed)
Patient ID: Kathy Beltran, female   DOB: April 17, 1928, 78 y.o.   MRN: [id number]  Subjective:    Patient ID: Kathy Beltran, female    DOB: 12-27-1927, 78 y.o.   MRN: [id number]  HPI  CC: CPX and no c/o BS running 120's.  History of Present Illness:   78  year old patient who is seen today for a comprehensive evaluation. Medical problems include hypertension, diabetes, and anxiety disorder.  She remains on triple therapy for blood pressure control. She also has a history of osteoarthritis, which has been stable.   She has a history of type 2 diabetes which has been quite stable. Her last hemoglobin A1c 6.3. She remains controlled on metformin therapy only  Allergies:  No Known Drug Allergies   Past History:  Past Medical History:  Reviewed history from 12/19/2006 and no changes required.  Anxiety  Diabetes mellitus, type II  Hypertension  Osteoarthritis   Past Surgical History:  Hip fracture ORIF  colonoscopy-none (declines)   Family History:  Reviewed history from 04/15/2007 and no changes required.  father died age 31, lung cancer  mother died age 61, because of the diabetes also, status post nephrectomy for renal cancer  One brother died age 33 pancreatic cancer  one sister, history of COPD, status post CVA deceased age 12  one brother, history of COPD ,  H/o MI (age 50)  Social History:  Reviewed history from 05/18/2008 and no changes required.  recently lost her daughter-in-law to illness  husband status post recent stroke   1. Risk factors, based on past  M,S,F history-  cardiovascular risk factors include diabetes hypertension and family history of coronary artery disease  2.  Physical activities: Fairly active without exercise limitations no rigorous regular exercise program  3.  Depression/mood: No history of depression has a history of chronic anxiety controlled with lorazepam  4.  Hearing: Minor impairment following  5.  ADL's: Independent in all aspects of  daily living 6.  Fall risk: Low  7.  Home safety: No problems identified 8.  Height weight, and visual acuity; height and weight stable no change in visual acuity. No eye exam in approximately 2 years 9.  Counseling: eye exam recommended. Regular exercise restricted sodium diet all encouraged  10. Lab orders based on risk factors: Laboratory profile including lipid panel and hemoglobin A1c will be reviewed 11. Referral: Not appropriate at this time  12. Care plan: Heart healthy diet regular exercise hemoglobin A1c is every 3 months encouraged  13. Cognitive assessment: Alert and oriented with normal affect. No cognitive dysfunction.     Review of Systems  Constitutional: Negative for fever, appetite change, fatigue and unexpected weight change.  HENT: Negative for hearing loss, ear pain, nosebleeds, congestion, sore throat, mouth sores, trouble swallowing, neck stiffness, dental problem, voice change, sinus pressure and tinnitus.   Eyes: Negative for photophobia, pain, redness and visual disturbance.  Respiratory: Negative for cough, chest tightness and shortness of breath.   Cardiovascular: Negative for chest pain, palpitations and leg swelling.  Gastrointestinal: Negative for nausea, vomiting, abdominal pain, diarrhea, constipation, blood in stool, abdominal distention and rectal pain.  Genitourinary: Negative for dysuria, urgency, frequency, hematuria, flank pain, vaginal bleeding, vaginal discharge, difficulty urinating, genital sores, vaginal pain, menstrual problem and pelvic pain.  Musculoskeletal: Negative for back pain and arthralgias.  Skin: Negative for rash.  Neurological: Negative for dizziness, syncope, speech difficulty, weakness, light-headedness, numbness and headaches.  Hematological: Negative for adenopathy. Does not bruise/bleed  easily.  Psychiatric/Behavioral: Positive for sleep disturbance. Negative for suicidal ideas, behavioral problems, self-injury, dysphoric  mood and agitation. The patient is nervous/anxious.        Objective:   Physical Exam  Constitutional: She is oriented to person, place, and time. She appears well-developed and well-nourished.  HENT:  Head: Normocephalic and atraumatic.  Right Ear: External ear normal.  Left Ear: External ear normal.  Mouth/Throat: Oropharynx is clear and moist.  Eyes: Conjunctivae normal and EOM are normal.  Neck: Normal range of motion. Neck supple. No JVD present. No thyromegaly present.  Cardiovascular: Normal rate, regular rhythm, normal heart sounds and intact distal pulses.   No murmur heard. Pulmonary/Chest: Effort normal and breath sounds normal. She has no wheezes. She has no rales.  Abdominal: Soft. Bowel sounds are normal. She exhibits no distension and no mass. There is no tenderness. There is no rebound and no guarding.  Musculoskeletal: Normal range of motion. She exhibits no edema and no tenderness.  Neurological: She is alert and oriented to person, place, and time. She has normal reflexes. No cranial nerve deficit. She exhibits normal muscle tone. Coordination normal.       Exam unremarkable. Intact to monofilament testing and vibratory sensation as well as proprioception No areas of skin breakage Pedal pulses full  Skin: Skin is warm and dry. No rash noted.  Psychiatric: She has a normal mood and affect. Her behavior is normal.          Assessment & Plan:   Preventive health examination Diabetes mellitus well controlled. Last hemoglobin A1c 6.3 Hypertension controlled. We'll continue present regimen  Laboratory update reviewed Recheck 3 months

## 2012-07-22 ENCOUNTER — Ambulatory Visit (INDEPENDENT_AMBULATORY_CARE_PROVIDER_SITE_OTHER): Payer: Medicare Other | Admitting: Internal Medicine

## 2012-07-22 ENCOUNTER — Encounter: Payer: Self-pay | Admitting: Internal Medicine

## 2012-07-22 VITALS — BP 146/70 | HR 81 | Temp 97.9°F | Resp 20 | Wt 137.0 lb

## 2012-07-22 DIAGNOSIS — F411 Generalized anxiety disorder: Secondary | ICD-10-CM

## 2012-07-22 DIAGNOSIS — E119 Type 2 diabetes mellitus without complications: Secondary | ICD-10-CM

## 2012-07-22 DIAGNOSIS — I10 Essential (primary) hypertension: Secondary | ICD-10-CM

## 2012-07-22 LAB — HEMOGLOBIN A1C: Hgb A1c MFr Bld: 6.3 % (ref 4.6–6.5)

## 2012-07-22 MED ORDER — ZOLPIDEM TARTRATE 5 MG PO TABS: 5.0000 mg | ORAL_TABLET | Freq: Every evening | ORAL | Status: DC | PRN

## 2012-07-22 NOTE — Patient Instructions (Signed)
Limit your sodium (Salt) intake   Please check your hemoglobin A1c every 3 months  Please check your blood pressure on a regular basis.  If it is consistently greater than 150/90, please make an office appointment.   

## 2012-07-22 NOTE — Progress Notes (Signed)
Subjective:    Patient ID: Kathy Beltran, female    DOB: 04/08/1928, 79 y.o.   MRN: [id number]  HPI  79 year old patient who is seen today for her quarterly followup. She has type 2 diabetes controlled on metformin therapy. Hemoglobin A1c is for the past 2 years have been very well-controlled. She has a history of anxiety with insomnia and states that lorazepam has not been very effective. She takes this at bedtime only. She is requesting alternative medication. She has treated hypertension.  Past Medical History  Diagnosis Date  . DIABETES MELLITUS, TYPE II 12/19/2006  . ANXIETY 12/19/2006  . HYPERTENSION 12/19/2006  . OSTEOARTHRITIS 12/19/2006    History   Social History  . Marital Status: Married    Spouse Name: N/A    Number of Children: N/A  . Years of Education: N/A   Occupational History  . Not on file.   Social History Main Topics  . Smoking status: Former Games developer  . Smokeless tobacco: Never Used  . Alcohol Use: No  . Drug Use: No  . Sexually Active: Not on file   Other Topics Concern  . Not on file   Social History Narrative  . No narrative on file    Past Surgical History  Procedure Laterality Date  . Hip open reduction      hip fx    Family History  Problem Relation Age of Onset  . Cancer Mother     renal ca - status post nephrectomy  . Diabetes Mother   . Cancer Sister     died of pancreatic ca  . COPD Sister   . Stroke Sister   . Cancer Brother     died of pancreatic ca  . COPD Brother     No Known Allergies  Current Outpatient Prescriptions on File Prior to Visit  Medication Sig Dispense Refill  . benazepril (LOTENSIN) 20 MG tablet Take 1 tablet (20 mg total) by mouth daily.  90 tablet  6  . LORazepam (ATIVAN) 0.5 MG tablet Take 1 tablet (0.5 mg total) by mouth every 8 (eight) hours as needed for anxiety.  90 tablet  0  . metFORMIN (GLUCOPHAGE-XR) 500 MG 24 hr tablet Take 1 tablet (500 mg total) by mouth daily with breakfast.  90 tablet  6  .  verapamil (CALAN-SR) 240 MG CR tablet Take 1 tablet (240 mg total) by mouth at bedtime.  90 tablet  6   No current facility-administered medications on file prior to visit.    BP 146/70  Pulse 81  Temp(Src) 97.9 F (36.6 C) (Oral)  Resp 20  Wt 137 lb (62.143 kg)  BMI 23.88 kg/m2  SpO2 95%       Review of Systems  Constitutional: Negative.   HENT: Negative for hearing loss, congestion, sore throat, rhinorrhea, dental problem, sinus pressure and tinnitus.   Eyes: Negative for pain, discharge and visual disturbance.  Respiratory: Negative for cough and shortness of breath.   Cardiovascular: Negative for chest pain, palpitations and leg swelling.  Gastrointestinal: Negative for nausea, vomiting, abdominal pain, diarrhea, constipation, blood in stool and abdominal distention.  Genitourinary: Negative for dysuria, urgency, frequency, hematuria, flank pain, vaginal bleeding, vaginal discharge, difficulty urinating, vaginal pain and pelvic pain.  Musculoskeletal: Positive for arthralgias. Negative for joint swelling and gait problem.  Skin: Negative for rash.  Neurological: Negative for dizziness, syncope, speech difficulty, weakness, numbness and headaches.  Hematological: Negative for adenopathy.  Psychiatric/Behavioral: Positive for sleep disturbance. Negative for behavioral  problems, dysphoric mood and agitation. The patient is nervous/anxious.        Objective:   Physical Exam  Constitutional: She is oriented to person, place, and time. She appears well-developed and well-nourished.  HENT:  Head: Normocephalic.  Right Ear: External ear normal.  Left Ear: External ear normal.  Mouth/Throat: Oropharynx is clear and moist.  Eyes: Conjunctivae and EOM are normal. Pupils are equal, round, and reactive to light.  Neck: Normal range of motion. Neck supple. No thyromegaly present.  Cardiovascular: Normal rate, regular rhythm, normal heart sounds and intact distal pulses.    Pulmonary/Chest: Effort normal and breath sounds normal.  Abdominal: Soft. Bowel sounds are normal. She exhibits no mass. There is no tenderness.  Musculoskeletal: Normal range of motion.  Lymphadenopathy:    She has no cervical adenopathy.  Neurological: She is alert and oriented to person, place, and time.  Skin: Skin is warm and dry. No rash noted.  Psychiatric: She has a normal mood and affect. Her behavior is normal.          Assessment & Plan:    Diabetes mellitus. Well controlled. We'll check a hemoglobin A1c Hypertension nicely controlled Anxiety disorder/insomnia. We'll discontinue lorazepam and give a trial of Ambien at bedtime only  Return in 3 months

## 2012-10-21 ENCOUNTER — Ambulatory Visit (INDEPENDENT_AMBULATORY_CARE_PROVIDER_SITE_OTHER): Payer: Medicare Other | Admitting: Internal Medicine

## 2012-10-21 ENCOUNTER — Encounter: Payer: Self-pay | Admitting: Internal Medicine

## 2012-10-21 VITALS — BP 150/68 | HR 93 | Temp 97.9°F | Resp 20 | Wt 132.0 lb

## 2012-10-21 DIAGNOSIS — F411 Generalized anxiety disorder: Secondary | ICD-10-CM

## 2012-10-21 DIAGNOSIS — I10 Essential (primary) hypertension: Secondary | ICD-10-CM

## 2012-10-21 DIAGNOSIS — E119 Type 2 diabetes mellitus without complications: Secondary | ICD-10-CM

## 2012-10-21 MED ORDER — LORAZEPAM 0.5 MG PO TABS: 0.5000 mg | ORAL_TABLET | Freq: Two times a day (BID) | ORAL | Status: DC | PRN

## 2012-10-21 NOTE — Progress Notes (Signed)
  Subjective:    Patient ID: Kathy Beltran, female    DOB: 25-Oct-1927, 79 y.o.   MRN: [id number]  HPI  Wt Readings from Last 3 Encounters:  10/21/12 132 lb (59.875 kg)  07/22/12 137 lb (62.143 kg)  04/22/12 137 lb (62.143 kg)    Review of Systems     Objective:   Physical Exam        Assessment & Plan:

## 2012-10-21 NOTE — Patient Instructions (Signed)
Limit your sodium (Salt) intake    It is important that you exercise regularly, at least 20 minutes 3 to 4 times per week.  If you develop chest pain or shortness of breath seek  medical attention.  Return in 3 months for follow-up  

## 2012-10-21 NOTE — Progress Notes (Signed)
Subjective:    Patient ID: Kathy Beltran, female    DOB: Mar 30, 1928, 79 y.o.   MRN: [id number]  HPI  79 year old patient who is seen today for followup of diabetes and hypertension. She has a history of insomnia and anxiety and was given a trial of Ambien 3 months ago. She wishes to switch back to lorazepam. In general doing quite well. Hemoglobin A1c is remain very well controlled on low dose metformin therapy only. No new concerns or complaints.  Past Medical History  Diagnosis Date  . DIABETES MELLITUS, TYPE II 12/19/2006  . ANXIETY 12/19/2006  . HYPERTENSION 12/19/2006  . OSTEOARTHRITIS 12/19/2006    History   Social History  . Marital Status: Married    Spouse Name: N/A    Number of Children: N/A  . Years of Education: N/A   Occupational History  . Not on file.   Social History Main Topics  . Smoking status: Former Games developer  . Smokeless tobacco: Never Used  . Alcohol Use: No  . Drug Use: No  . Sexually Active: Not on file   Other Topics Concern  . Not on file   Social History Narrative  . No narrative on file    Past Surgical History  Procedure Laterality Date  . Hip open reduction      hip fx    Family History  Problem Relation Age of Onset  . Cancer Mother     renal ca - status post nephrectomy  . Diabetes Mother   . Cancer Sister     died of pancreatic ca  . COPD Sister   . Stroke Sister   . Cancer Brother     died of pancreatic ca  . COPD Brother     No Known Allergies  Current Outpatient Prescriptions on File Prior to Visit  Medication Sig Dispense Refill  . benazepril (LOTENSIN) 20 MG tablet Take 1 tablet (20 mg total) by mouth daily.  90 tablet  6  . metFORMIN (GLUCOPHAGE-XR) 500 MG 24 hr tablet Take 1 tablet (500 mg total) by mouth daily with breakfast.  90 tablet  6  . verapamil (CALAN-SR) 240 MG CR tablet Take 1 tablet (240 mg total) by mouth at bedtime.  90 tablet  6   No current facility-administered medications on file prior to visit.     BP 150/68  Pulse 93  Temp(Src) 97.9 F (36.6 C) (Oral)  Resp 20  Wt 132 lb (59.875 kg)  BMI 23.01 kg/m2  SpO2 95%        Review of Systems  Constitutional: Negative.   HENT: Negative for hearing loss, congestion, sore throat, rhinorrhea, dental problem, sinus pressure and tinnitus.   Eyes: Negative for pain, discharge and visual disturbance.  Respiratory: Negative for cough and shortness of breath.   Cardiovascular: Negative for chest pain, palpitations and leg swelling.  Gastrointestinal: Negative for nausea, vomiting, abdominal pain, diarrhea, constipation, blood in stool and abdominal distention.  Genitourinary: Negative for dysuria, urgency, frequency, hematuria, flank pain, vaginal bleeding, vaginal discharge, difficulty urinating, vaginal pain and pelvic pain.  Musculoskeletal: Negative for joint swelling, arthralgias and gait problem.  Skin: Negative for rash.  Neurological: Negative for dizziness, syncope, speech difficulty, weakness, numbness and headaches.  Hematological: Negative for adenopathy.  Psychiatric/Behavioral: Negative for behavioral problems, dysphoric mood and agitation. The patient is not nervous/anxious.        Objective:   Physical Exam  Constitutional: She is oriented to person, place, and time. She appears well-developed and  well-nourished.  HENT:  Head: Normocephalic.  Right Ear: External ear normal.  Left Ear: External ear normal.  Mouth/Throat: Oropharynx is clear and moist.  Eyes: Conjunctivae and EOM are normal. Pupils are equal, round, and reactive to light.  Neck: Normal range of motion. Neck supple. Thyromegaly present.  Cardiovascular: Normal rate, regular rhythm, normal heart sounds and intact distal pulses.   Pulmonary/Chest: Effort normal and breath sounds normal.  Kyphosis  Abdominal: Soft. Bowel sounds are normal. She exhibits no mass. There is no tenderness.  Musculoskeletal: Normal range of motion.  Lymphadenopathy:     She has no cervical adenopathy.  Neurological: She is alert and oriented to person, place, and time.  Skin: Skin is warm and dry. No rash noted.  Psychiatric: She has a normal mood and affect. Her behavior is normal.          Assessment & Plan:   Diabetes mellitus. Well controlled on current therapy. Check a hemoglobin A1c in 3 months Hypertension well controlled. We'll continue present regimen Insomnia/anxiety. We'll switch back to lorazepam

## 2012-11-06 ENCOUNTER — Other Ambulatory Visit: Payer: Self-pay | Admitting: Internal Medicine

## 2013-01-22 ENCOUNTER — Ambulatory Visit (INDEPENDENT_AMBULATORY_CARE_PROVIDER_SITE_OTHER): Payer: Medicare Other | Admitting: Internal Medicine

## 2013-01-22 ENCOUNTER — Encounter: Payer: Self-pay | Admitting: Internal Medicine

## 2013-01-22 ENCOUNTER — Telehealth: Payer: Self-pay | Admitting: Internal Medicine

## 2013-01-22 VITALS — BP 140/64 | HR 71 | Temp 98.2°F | Resp 20 | Wt 131.0 lb

## 2013-01-22 DIAGNOSIS — E119 Type 2 diabetes mellitus without complications: Secondary | ICD-10-CM

## 2013-01-22 DIAGNOSIS — F411 Generalized anxiety disorder: Secondary | ICD-10-CM

## 2013-01-22 DIAGNOSIS — I10 Essential (primary) hypertension: Secondary | ICD-10-CM

## 2013-01-22 DIAGNOSIS — Z23 Encounter for immunization: Secondary | ICD-10-CM

## 2013-01-22 DIAGNOSIS — M1990 Unspecified osteoarthritis, unspecified site: Secondary | ICD-10-CM

## 2013-01-22 MED ORDER — FIRST-DUKES MOUTHWASH MT SUSP: 5.0000 mL | Freq: Four times a day (QID) | OROMUCOSAL | Status: DC

## 2013-01-22 MED ORDER — LORAZEPAM 0.5 MG PO TABS: 0.5000 mg | ORAL_TABLET | Freq: Two times a day (BID) | ORAL | Status: DC | PRN

## 2013-01-22 NOTE — Telephone Encounter (Signed)
Called pharmacy spoke to pharmacist told her to dispense 240 ml's.

## 2013-01-22 NOTE — Telephone Encounter (Signed)
Pharmacy called and stated that the RX for Magic Mouth Wash, doesn't have a quantity. It simples says "1" on it, and they need a more specific quantity. Please assist.

## 2013-01-22 NOTE — Progress Notes (Signed)
Subjective:    Patient ID: Kathy Beltran, female    DOB: March 24, 1928, 79 y.o.   MRN: [id number]  HPI   79 year old patient who is seen today for her quarterly followup. She has history of type 2 diabetes which has been well-controlled on metformin therapy. She has hypertension as well as chronic anxiety. She has osteoarthritis. For the past several weeks she is said the episodic oral ulcerations. In general doing quite well.  Past Medical History  Diagnosis Date  . DIABETES MELLITUS, TYPE II 12/19/2006  . ANXIETY 12/19/2006  . HYPERTENSION 12/19/2006  . OSTEOARTHRITIS 12/19/2006    History   Social History  . Marital Status: Married    Spouse Name: N/A    Number of Children: N/A  . Years of Education: N/A   Occupational History  . Not on file.   Social History Main Topics  . Smoking status: Former Games developer  . Smokeless tobacco: Never Used  . Alcohol Use: No  . Drug Use: No  . Sexual Activity: Not on file   Other Topics Concern  . Not on file   Social History Narrative  . No narrative on file    Past Surgical History  Procedure Laterality Date  . Hip open reduction      hip fx    Family History  Problem Relation Age of Onset  . Cancer Mother     renal ca - status post nephrectomy  . Diabetes Mother   . Cancer Sister     died of pancreatic ca  . COPD Sister   . Stroke Sister   . Cancer Brother     died of pancreatic ca  . COPD Brother     No Known Allergies  Current Outpatient Prescriptions on File Prior to Visit  Medication Sig Dispense Refill  . benazepril (LOTENSIN) 20 MG tablet Take 1 tablet (20 mg total) by mouth daily.  90 tablet  6  . metFORMIN (GLUCOPHAGE-XR) 500 MG 24 hr tablet Take 1 tablet (500 mg total) by mouth daily with breakfast.  90 tablet  6  . verapamil (CALAN-SR) 240 MG CR tablet Take 1 tablet (240 mg total) by mouth at bedtime.  90 tablet  6   No current facility-administered medications on file prior to visit.    BP 140/64  Pulse 71   Temp(Src) 98.2 F (36.8 C) (Oral)  Resp 20  Wt 131 lb (59.421 kg)  BMI 22.84 kg/m2  SpO2 98%       Review of Systems  Constitutional: Negative.   HENT: Negative for hearing loss, congestion, sore throat, rhinorrhea, dental problem, sinus pressure and tinnitus.   Eyes: Negative for pain, discharge and visual disturbance.  Respiratory: Negative for cough and shortness of breath.   Cardiovascular: Negative for chest pain, palpitations and leg swelling.  Gastrointestinal: Negative for nausea, vomiting, abdominal pain, diarrhea, constipation, blood in stool and abdominal distention.  Genitourinary: Negative for dysuria, urgency, frequency, hematuria, flank pain, vaginal bleeding, vaginal discharge, difficulty urinating, vaginal pain and pelvic pain.  Musculoskeletal: Negative for joint swelling, arthralgias and gait problem.  Skin: Negative for rash.  Neurological: Negative for dizziness, syncope, speech difficulty, weakness, numbness and headaches.  Hematological: Negative for adenopathy.  Psychiatric/Behavioral: Negative for behavioral problems, dysphoric mood and agitation. The patient is not nervous/anxious.        Objective:   Physical Exam  Constitutional: She is oriented to person, place, and time. She appears well-developed and well-nourished.  HENT:  Head: Normocephalic.  Right  Ear: External ear normal.  Left Ear: External ear normal.  Mouth/Throat: Oropharynx is clear and moist.  A few scattered tiny oral ulcers  Eyes: Conjunctivae and EOM are normal. Pupils are equal, round, and reactive to light.  Neck: Normal range of motion. Neck supple. No thyromegaly present.  Cardiovascular: Normal rate, regular rhythm, normal heart sounds and intact distal pulses.   Pulmonary/Chest: Effort normal and breath sounds normal.  Abdominal: Soft. Bowel sounds are normal. She exhibits no mass. There is no tenderness.  Musculoskeletal: Normal range of motion.  Lymphadenopathy:    She  has no cervical adenopathy.  Neurological: She is alert and oriented to person, place, and time.  Skin: Skin is warm and dry. No rash noted.  Psychiatric: She has a normal mood and affect. Her behavior is normal.          Assessment & Plan:   Oral ulcers. We'll give a trial of Dukes mouthwash Hypertension stable Diabetes mellitus. Will check a hemoglobin A1c Osteoarthritis stable  CPX 3 months Annual eye examination recommended

## 2013-01-22 NOTE — Patient Instructions (Signed)
Limit your sodium (Salt) intake  Please see your eye doctor yearly to check for diabetic eye damage   Please check your hemoglobin A1c every 3 months  

## 2013-05-02 ENCOUNTER — Other Ambulatory Visit: Payer: Self-pay | Admitting: Internal Medicine

## 2013-05-05 ENCOUNTER — Other Ambulatory Visit: Payer: Self-pay | Admitting: Internal Medicine

## 2013-05-05 ENCOUNTER — Ambulatory Visit (INDEPENDENT_AMBULATORY_CARE_PROVIDER_SITE_OTHER): Payer: Medicare Other | Admitting: Internal Medicine

## 2013-05-05 ENCOUNTER — Encounter: Payer: Self-pay | Admitting: Internal Medicine

## 2013-05-05 VITALS — BP 160/78 | Temp 98.2°F | Ht 63.5 in | Wt 127.0 lb

## 2013-05-05 DIAGNOSIS — I10 Essential (primary) hypertension: Secondary | ICD-10-CM

## 2013-05-05 DIAGNOSIS — E785 Hyperlipidemia, unspecified: Secondary | ICD-10-CM

## 2013-05-05 DIAGNOSIS — E119 Type 2 diabetes mellitus without complications: Secondary | ICD-10-CM

## 2013-05-05 DIAGNOSIS — M1990 Unspecified osteoarthritis, unspecified site: Secondary | ICD-10-CM

## 2013-05-05 DIAGNOSIS — Z0000 Encounter for general adult medical examination without abnormal findings: Secondary | ICD-10-CM

## 2013-05-05 LAB — LIPID PANEL
Cholesterol: 173 mg/dL (ref 0–200)
HDL: 48.1 mg/dL (ref >39.00)
LDL Cholesterol: 109 mg/dL — ABNORMAL HIGH (ref 0–99)
Total CHOL/HDL Ratio: 4
Triglycerides: 80 mg/dL (ref 0.0–149.0)
VLDL: 16 mg/dL (ref 0.0–40.0)

## 2013-05-05 LAB — CBC WITH DIFFERENTIAL/PLATELET
Basophils Relative: 0.5 % (ref 0.0–3.0)
Eosinophils Absolute: 0.2 10*3/uL (ref 0.0–0.7)
Eosinophils Relative: 2.6 % (ref 0.0–5.0)
HCT: 33.6 % — ABNORMAL LOW (ref 36.0–46.0)
Hemoglobin: 11 g/dL — ABNORMAL LOW (ref 12.0–15.0)
Lymphocytes Relative: 18.3 % (ref 12.0–46.0)
MCHC: 32.8 g/dL (ref 30.0–36.0)
Monocytes Relative: 8.8 % (ref 3.0–12.0)
Neutro Abs: 4.9 10*3/uL (ref 1.4–7.7)
Neutrophils Relative %: 69.8 % (ref 43.0–77.0)
RBC: 4.14 Mil/uL (ref 3.87–5.11)
WBC: 7.1 10*3/uL (ref 4.5–10.5)

## 2013-05-05 LAB — COMPREHENSIVE METABOLIC PANEL
ALT: 11 U/L (ref 0–35)
AST: 20 U/L (ref 0–37)
Alkaline Phosphatase: 54 U/L (ref 39–117)
BUN: 13 mg/dL (ref 6–23)
CO2: 26 mEq/L (ref 19–32)
Calcium: 10.7 mg/dL — ABNORMAL HIGH (ref 8.4–10.5)
Creatinine, Ser: 0.8 mg/dL (ref 0.4–1.2)
GFR: 70.69 mL/min (ref >60.00)
Sodium: 137 mEq/L (ref 135–145)
Total Bilirubin: 0.3 mg/dL (ref 0.3–1.2)

## 2013-05-05 MED ORDER — BENAZEPRIL HCL 20 MG PO TABS: ORAL_TABLET | ORAL | Status: DC

## 2013-05-05 MED ORDER — AMLODIPINE BESYLATE 2.5 MG PO TABS: 2.5000 mg | ORAL_TABLET | Freq: Every day | ORAL | Status: DC

## 2013-05-05 MED ORDER — ATORVASTATIN CALCIUM 20 MG PO TABS: 20.0000 mg | ORAL_TABLET | Freq: Every day | ORAL | Status: DC

## 2013-05-05 MED ORDER — LORAZEPAM 0.5 MG PO TABS: ORAL_TABLET | ORAL | Status: DC

## 2013-05-05 MED ORDER — METFORMIN HCL ER 500 MG PO TB24: ORAL_TABLET | ORAL | Status: DC

## 2013-05-05 NOTE — Patient Instructions (Signed)
It is important that you exercise regularly, at least 20 minutes 3 to 4 times per week.  If you develop chest pain or shortness of breath seek  medical attention.  Limit your sodium (Salt) intake   Please check your hemoglobin A1c every 3 months   

## 2013-05-05 NOTE — Progress Notes (Signed)
Pre visit review using our clinic review tool, if applicable. No additional management support is needed unless otherwise documented below in the visit note. 

## 2013-05-05 NOTE — Progress Notes (Signed)
Patient ID: Kathy Beltran, female   DOB: 04-20-1928, 79 y.o.   MRN: [id number]  Subjective:    Patient ID: Kathy Beltran, female    DOB: 11/08/1927, 79 y.o.   MRN: [id number]  HPI   CC: CPX and no c/o BS running 120's.  History of Present Illness:   79  year old patient who is seen today for a comprehensive evaluation. Medical problems include hypertension, diabetes, and anxiety disorder.  She remains on triple therapy for blood pressure control. She also has a history of osteoarthritis, which has been stable.   She has a history of type 2 diabetes which has been quite stable. Her last hemoglobin A1c 6.6. She remains controlled on metformin therapy only States did have eye examination 2014  Allergies:  No Known Drug Allergies   Past History:  Past Medical History:   Anxiety  Diabetes mellitus, type II  Hypertension  Osteoarthritis   Past Surgical History:  Hip fracture ORIF  colonoscopy-none (declines)   Family History:   father died age 48, lung cancer  mother died age 63, because of the diabetes also, status post nephrectomy for renal cancer  One brother died age 84 pancreatic cancer  one sister, history of COPD, status post CVA deceased age 13  one brother, history of COPD ,  H/o MI (age 14)  Social History:   recently lost her daughter-in-law to illness  husband status post recent stroke   1. Risk factors, based on past  M,S,F history-  cardiovascular risk factors include diabetes hypertension and family history of coronary artery disease  2.  Physical activities: Fairly active without exercise limitations no rigorous regular exercise program  3.  Depression/mood: No history of depression has a history of chronic anxiety controlled with lorazepam  4.  Hearing: Minor impairment following  5.  ADL's: Independent in all aspects of daily living 6.  Fall risk: Low  7.  Home safety: No problems identified 8.  Height weight, and visual acuity; height and weight  stable no change in visual acuity. No eye exam in approximately 2 years 9.  Counseling: eye exam recommended. Regular exercise restricted sodium diet all encouraged  10. Lab orders based on risk factors: Laboratory profile including lipid panel and hemoglobin A1c will be reviewed 11. Referral: Not appropriate at this time  12. Care plan: Heart healthy diet regular exercise hemoglobin A1c is every 3 months encouraged  13. Cognitive assessment: Alert and oriented with normal affect. No cognitive dysfunction.     Review of Systems  Constitutional: Negative for fever, appetite change, fatigue and unexpected weight change.  HENT: Negative for congestion, dental problem, ear pain, hearing loss, mouth sores, nosebleeds, sinus pressure, sore throat, tinnitus, trouble swallowing and voice change.   Eyes: Negative for photophobia, pain, redness and visual disturbance.  Respiratory: Negative for cough, chest tightness and shortness of breath.   Cardiovascular: Negative for chest pain, palpitations and leg swelling.  Gastrointestinal: Negative for nausea, vomiting, abdominal pain, diarrhea, constipation, blood in stool, abdominal distention and rectal pain.  Genitourinary: Negative for dysuria, urgency, frequency, hematuria, flank pain, vaginal bleeding, vaginal discharge, difficulty urinating, genital sores, vaginal pain, menstrual problem and pelvic pain.  Musculoskeletal: Negative for arthralgias, back pain and neck stiffness.  Skin: Negative for rash.  Neurological: Negative for dizziness, syncope, speech difficulty, weakness, light-headedness, numbness and headaches.  Hematological: Negative for adenopathy. Does not bruise/bleed easily.  Psychiatric/Behavioral: Positive for sleep disturbance. Negative for suicidal ideas, behavioral problems, self-injury, dysphoric mood and  agitation. The patient is nervous/anxious.        Objective:   Physical Exam  Constitutional: She is oriented to person,  place, and time. She appears well-developed and well-nourished.  HENT:  Head: Normocephalic and atraumatic.  Right Ear: External ear normal.  Left Ear: External ear normal.  Mouth/Throat: Oropharynx is clear and moist.  Eyes: Conjunctivae and EOM are normal.  Neck: Normal range of motion. Neck supple. No JVD present. No thyromegaly present.  Cardiovascular: Normal rate, regular rhythm, normal heart sounds and intact distal pulses.   No murmur heard. Pulmonary/Chest: Effort normal and breath sounds normal. She has no wheezes. She has no rales.  Abdominal: Soft. Bowel sounds are normal. She exhibits no distension and no mass. There is no tenderness. There is no rebound and no guarding.  Musculoskeletal: Normal range of motion. She exhibits no edema and no tenderness.  Neurological: She is alert and oriented to person, place, and time. She has normal reflexes. No cranial nerve deficit. She exhibits normal muscle tone. Coordination normal.  Exam unremarkable. Intact to monofilament testing vibratory sensation diminished No areas of skin breakage Pedal pulses full  Skin: Skin is warm and dry. No rash noted.  Psychiatric: She has a normal mood and affect. Her behavior is normal.          Assessment & Plan:   Preventive health examination Diabetes mellitus well controlled. Last hemoglobin A1c 6.6 Hypertension controlled. We'll continue present regimen Statin therapy added We'll check stool for occult blood x4 as well as laboratory update   Laboratory update reviewed Recheck 3 months

## 2013-07-29 ENCOUNTER — Other Ambulatory Visit: Payer: Self-pay | Admitting: Internal Medicine

## 2013-07-31 ENCOUNTER — Other Ambulatory Visit: Payer: Self-pay | Admitting: Internal Medicine

## 2013-08-04 ENCOUNTER — Encounter: Payer: Self-pay | Admitting: Internal Medicine

## 2013-08-04 ENCOUNTER — Ambulatory Visit (INDEPENDENT_AMBULATORY_CARE_PROVIDER_SITE_OTHER): Payer: Medicare Other | Admitting: Internal Medicine

## 2013-08-04 VITALS — BP 130/62 | HR 81 | Temp 97.8°F | Resp 20 | Ht 63.5 in | Wt 129.0 lb

## 2013-08-04 DIAGNOSIS — F411 Generalized anxiety disorder: Secondary | ICD-10-CM

## 2013-08-04 DIAGNOSIS — M1990 Unspecified osteoarthritis, unspecified site: Secondary | ICD-10-CM

## 2013-08-04 DIAGNOSIS — I10 Essential (primary) hypertension: Secondary | ICD-10-CM

## 2013-08-04 DIAGNOSIS — E119 Type 2 diabetes mellitus without complications: Secondary | ICD-10-CM

## 2013-08-04 LAB — HEMOGLOBIN A1C: Hgb A1c MFr Bld: 6.5 % (ref 4.6–6.5)

## 2013-08-04 MED ORDER — LORAZEPAM 0.5 MG PO TABS: ORAL_TABLET | ORAL | Status: DC

## 2013-08-04 NOTE — Progress Notes (Signed)
Subjective:    Patient ID: Kathy Beltran, female    DOB: [birth year], 80 y.o.   MRN: [id number]  HPI  80 year old patient who is in today for her quarterly followup for type 2 diabetes.  She does quite well.  Her major concerns or complaints.  She does complain of some mild cramping in the feet and legs, mainly through the night.  She has treated hypertension and is on statin therapy.  She has mild anxiety and is on lorazepam, which she takes when necessary.  Past Medical History  Diagnosis Date  . DIABETES MELLITUS, TYPE II 12/19/2006  . ANXIETY 12/19/2006  . HYPERTENSION 12/19/2006  . OSTEOARTHRITIS 12/19/2006    History   Social History  . Marital Status: Married    Spouse Name: N/A    Number of Children: N/A  . Years of Education: N/A   Occupational History  . Not on file.   Social History Main Topics  . Smoking status: Former Research scientist (life sciences)  . Smokeless tobacco: Never Used  . Alcohol Use: No  . Drug Use: No  . Sexual Activity: Not on file   Other Topics Concern  . Not on file   Social History Narrative  . No narrative on file    Past Surgical History  Procedure Laterality Date  . Hip open reduction      hip fx    Family History  Problem Relation Age of Onset  . Cancer Mother     renal ca - status post nephrectomy  . Diabetes Mother   . Cancer Sister     died of pancreatic ca  . COPD Sister   . Stroke Sister   . Cancer Brother     died of pancreatic ca  . COPD Brother     No Known Allergies  Current Outpatient Prescriptions on File Prior to Visit  Medication Sig Dispense Refill  . amLODipine (NORVASC) 2.5 MG tablet Take 1 tablet (2.5 mg total) by mouth daily.  90 tablet  3  . atorvastatin (LIPITOR) 20 MG tablet Take 1 tablet (20 mg total) by mouth daily.  90 tablet  3  . benazepril (LOTENSIN) 20 MG tablet TAKE ONE TABLET BY MOUTH ONCE DAILY  90 tablet  6  . metFORMIN (GLUCOPHAGE-XR) 500 MG 24 hr tablet TAKE ONE TABLET BY MOUTH DAILY WITH  BREAKFAST  90 tablet   6  . verapamil (CALAN-SR) 240 MG CR tablet TAKE ONE TABLET BY MOUTH AT BEDTIME.  90 tablet  0   No current facility-administered medications on file prior to visit.    BP 130/62  Pulse 81  Temp(Src) 97.8 F (36.6 C) (Oral)  Resp 20  Ht 5' 3.5" (1.613 m)  Wt 129 lb (58.514 kg)  BMI 22.49 kg/m2  SpO2 96%       Review of Systems  Constitutional: Negative.   HENT: Negative for congestion, dental problem, hearing loss, rhinorrhea, sinus pressure, sore throat and tinnitus.   Eyes: Negative for pain, discharge and visual disturbance.  Respiratory: Negative for cough and shortness of breath.   Cardiovascular: Negative for chest pain, palpitations and leg swelling.  Gastrointestinal: Negative for nausea, vomiting, abdominal pain, diarrhea, constipation, blood in stool and abdominal distention.  Genitourinary: Negative for dysuria, urgency, frequency, hematuria, flank pain, vaginal bleeding, vaginal discharge, difficulty urinating, vaginal pain and pelvic pain.  Musculoskeletal: Positive for arthralgias and myalgias. Negative for gait problem and joint swelling.  Skin: Negative for rash.  Neurological: Negative for dizziness, syncope, speech  difficulty, weakness, numbness and headaches.  Hematological: Negative for adenopathy.  Psychiatric/Behavioral: Negative for behavioral problems, dysphoric mood and agitation. The patient is not nervous/anxious.        Objective:   Physical Exam  Constitutional: She is oriented to person, place, and time. She appears well-developed and well-nourished.  HENT:  Head: Normocephalic.  Right Ear: External ear normal.  Left Ear: External ear normal.  Mouth/Throat: Oropharynx is clear and moist.  Eyes: Conjunctivae and EOM are normal. Pupils are equal, round, and reactive to light.  Neck: Normal range of motion. Neck supple. No thyromegaly present.  Cardiovascular: Normal rate, regular rhythm and normal heart sounds.   Pedal pulses decreased on  the left  Pulmonary/Chest: Effort normal and breath sounds normal.  Abdominal: Soft. Bowel sounds are normal. She exhibits no mass. There is no tenderness.  Musculoskeletal: Normal range of motion.  Lymphadenopathy:    She has no cervical adenopathy.  Neurological: She is alert and oriented to person, place, and time.  Skin: Skin is warm and dry. No rash noted.  Psychiatric: She has a normal mood and affect. Her behavior is normal.          Assessment & Plan:   Diabetes mellitus.  Remains under excellent control.  We'll check a hemoglobin A1c.  Hypertension, well controlled, dyslipidemia, stable.  Anxiety state, stable.  Lorazepam refilled, osteoarthritis, stable.

## 2013-08-04 NOTE — Progress Notes (Signed)
Pre-visit discussion using our clinic review tool. No additional management support is needed unless otherwise documented below in the visit note.  

## 2013-08-04 NOTE — Patient Instructions (Signed)
Limit your sodium (Salt) intake   Please check your hemoglobin A1c every 3 months   

## 2013-08-05 ENCOUNTER — Telehealth: Payer: Self-pay | Admitting: Internal Medicine

## 2013-08-05 NOTE — Telephone Encounter (Signed)
Relevant patient education mailed to patient.  

## 2013-08-13 ENCOUNTER — Telehealth: Payer: Self-pay

## 2013-08-13 NOTE — Telephone Encounter (Signed)
Relevant patient education mailed to patient.  

## 2013-11-05 ENCOUNTER — Ambulatory Visit (INDEPENDENT_AMBULATORY_CARE_PROVIDER_SITE_OTHER): Payer: Medicare Other | Admitting: Internal Medicine

## 2013-11-05 ENCOUNTER — Encounter: Payer: Self-pay | Admitting: Internal Medicine

## 2013-11-05 VITALS — BP 160/88 | HR 83 | Temp 98.5°F | Resp 20 | Ht 63.5 in | Wt 130.0 lb

## 2013-11-05 DIAGNOSIS — M1990 Unspecified osteoarthritis, unspecified site: Secondary | ICD-10-CM

## 2013-11-05 DIAGNOSIS — F411 Generalized anxiety disorder: Secondary | ICD-10-CM

## 2013-11-05 DIAGNOSIS — E119 Type 2 diabetes mellitus without complications: Secondary | ICD-10-CM

## 2013-11-05 DIAGNOSIS — I10 Essential (primary) hypertension: Secondary | ICD-10-CM

## 2013-11-05 LAB — HEMOGLOBIN A1C: Hgb A1c MFr Bld: 7.1 % — ABNORMAL HIGH (ref 4.6–6.5)

## 2013-11-05 MED ORDER — LORAZEPAM 0.5 MG PO TABS: ORAL_TABLET | ORAL | Status: DC

## 2013-11-05 MED ORDER — BENAZEPRIL-HYDROCHLOROTHIAZIDE 20-12.5 MG PO TABS: 1.0000 | ORAL_TABLET | Freq: Every day | ORAL | Status: DC

## 2013-11-05 NOTE — Progress Notes (Signed)
Subjective:    Patient ID: Kathy Beltran, female    DOB: 22-Aug-1927, 80 y.o.   MRN: [id number]  HPI 80 year old patient who is seen today for followup.  She has type 2 diabetes, which has been well-controlled on metformin therapy.  She has had a recent eye examination about 2 months ago.  She states that she was treated for a retinal hemorrhage. She has hypertension and has been on Lotensin and amlodipine.  She remains on atorvastatin for dyslipidemia.  She has chronic anxiety disorder.  Past Medical History  Diagnosis Date  . DIABETES MELLITUS, TYPE II 12/19/2006  . ANXIETY 12/19/2006  . HYPERTENSION 12/19/2006  . OSTEOARTHRITIS 12/19/2006    History   Social History  . Marital Status: Married    Spouse Name: N/A    Number of Children: N/A  . Years of Education: N/A   Occupational History  . Not on file.   Social History Main Topics  . Smoking status: Former Research scientist (life sciences)  . Smokeless tobacco: Never Used  . Alcohol Use: No  . Drug Use: No  . Sexual Activity: Not on file   Other Topics Concern  . Not on file   Social History Narrative  . No narrative on file    Past Surgical History  Procedure Laterality Date  . Hip open reduction      hip fx    Family History  Problem Relation Age of Onset  . Cancer Mother     renal ca - status post nephrectomy  . Diabetes Mother   . Cancer Sister     died of pancreatic ca  . COPD Sister   . Stroke Sister   . Cancer Brother     died of pancreatic ca  . COPD Brother     No Known Allergies  Current Outpatient Prescriptions on File Prior to Visit  Medication Sig Dispense Refill  . amLODipine (NORVASC) 2.5 MG tablet Take 1 tablet (2.5 mg total) by mouth daily.  90 tablet  3  . atorvastatin (LIPITOR) 20 MG tablet Take 1 tablet (20 mg total) by mouth daily.  90 tablet  3  . benazepril (LOTENSIN) 20 MG tablet TAKE ONE TABLET BY MOUTH ONCE DAILY  90 tablet  6  . LORazepam (ATIVAN) 0.5 MG tablet TAKE ONE TABLET BY MOUTH TWICE DAILY  AS NEEDED FOR ANXIETY  60 tablet  2  . metFORMIN (GLUCOPHAGE-XR) 500 MG 24 hr tablet TAKE ONE TABLET BY MOUTH DAILY WITH  BREAKFAST  90 tablet  6   No current facility-administered medications on file prior to visit.    BP 160/88  Pulse 83  Temp(Src) 98.5 F (36.9 C) (Oral)  Resp 20  Ht 5' 3.5" (1.613 m)  Wt 130 lb (58.968 kg)  BMI 22.66 kg/m2  SpO2 95%      Review of Systems  Constitutional: Negative.   HENT: Negative for congestion, dental problem, hearing loss, rhinorrhea, sinus pressure, sore throat and tinnitus.   Eyes: Negative for pain, discharge and visual disturbance.  Respiratory: Negative for cough and shortness of breath.   Cardiovascular: Negative for chest pain, palpitations and leg swelling.  Gastrointestinal: Negative for nausea, vomiting, abdominal pain, diarrhea, constipation, blood in stool and abdominal distention.  Genitourinary: Negative for dysuria, urgency, frequency, hematuria, flank pain, vaginal bleeding, vaginal discharge, difficulty urinating, vaginal pain and pelvic pain.  Musculoskeletal: Negative for arthralgias, gait problem and joint swelling.  Skin: Negative for rash.  Neurological: Negative for dizziness, syncope, speech difficulty, weakness,  numbness and headaches.  Hematological: Negative for adenopathy.  Psychiatric/Behavioral: Negative for behavioral problems, dysphoric mood and agitation. The patient is nervous/anxious.        Objective:   Physical Exam  Constitutional: She is oriented to person, place, and time. She appears well-developed and well-nourished.  Blood pressure 180/80  HENT:  Head: Normocephalic.  Right Ear: External ear normal.  Left Ear: External ear normal.  Mouth/Throat: Oropharynx is clear and moist.  Eyes: Conjunctivae and EOM are normal. Pupils are equal, round, and reactive to light.  Neck: Normal range of motion. Neck supple. Thyromegaly present.  Large  goiter, more prominent on the right  Cardiovascular:  Normal rate, regular rhythm, normal heart sounds and intact distal pulses.   Pulmonary/Chest: Effort normal and breath sounds normal.  Abdominal: Soft. Bowel sounds are normal. She exhibits no mass. There is no tenderness.  Musculoskeletal: Normal range of motion.  Lymphadenopathy:    She has no cervical adenopathy.  Neurological: She is alert and oriented to person, place, and time.  Skin: Skin is warm and dry. No rash noted.  Psychiatric: She has a normal mood and affect. Her behavior is normal.          Assessment & Plan:   Hypertension.  Suboptimal control with elevated systolic readings.  We'll add hydrochlorothiazide 12 point 5 mg.  Recheck 6 weeks. Diabetes mellitus.  We'll check a hemoglobin A1c Dyslipidemia.  Continue atorvastatin Anxiety disorder  Continue lorazepam

## 2013-11-05 NOTE — Progress Notes (Signed)
Pre-visit discussion using our clinic review tool. No additional management support is needed unless otherwise documented below in the visit note.  

## 2013-11-05 NOTE — Patient Instructions (Signed)
Limit your sodium (Salt) intake  Please check your blood pressure on a regular basis.  If it is consistently greater than 150/90, please make an office appointment.   

## 2013-11-06 ENCOUNTER — Other Ambulatory Visit: Payer: Self-pay | Admitting: Internal Medicine

## 2013-12-09 ENCOUNTER — Telehealth: Payer: Self-pay | Admitting: Internal Medicine

## 2013-12-09 NOTE — Telephone Encounter (Signed)
Patient Information:  Caller Name: Vicente Serene  Phone: [phone]  Patient: Kathy Beltran  Gender: Female  DOB: 1927/12/04  Age: 80 Years  PCP: Bluford Kaufmann (Family Practice > 70yrs old)  Office Follow Up:  Does the office need to follow up with this patient?: Yes  Instructions For The Office: Son needs further instructions.  RN Note:  Son would like to know if an appointment can be added onto today's schedule, or if provider feels he can wait to bring patient to the office tomorrow vs going to an urgent care. Please contact son to give further instructions.  Symptoms  Reason For Call & Symptoms: Anxiety which gets worse after injection in the eyes. Not eating due to anxiety level. Tells son "something is wrong." He reports this is the worst he has seen the anxiety.  Reviewed Health History In EMR: Yes  Reviewed Medications In EMR: Yes  Reviewed Allergies In EMR: Yes  Reviewed Surgeries / Procedures: Yes  Date of Onset of Symptoms: 12/07/2013  Guideline(s) Used:  No Protocol Available - Sick Adult  Disposition Per Guideline:   See Today in Office  Reason For Disposition Reached:   Nursing judgment  Advice Given:  N/A  Patient Will Follow Care Advice:  YES

## 2013-12-10 MED ORDER — LORAZEPAM 0.5 MG PO TABS: ORAL_TABLET | ORAL | Status: DC

## 2013-12-10 NOTE — Telephone Encounter (Signed)
Spoke to pt's son Vicente Serene, apologized not getting back to him. Asked how pt was? He said she is better did not take her medication the other night to sleep because she is worried she will run out till appointment took it last night and is better today. Told him will call Rx into pharmacy with refills so she has enough. Vicente Serene verbalized understanding.

## 2013-12-17 ENCOUNTER — Ambulatory Visit (INDEPENDENT_AMBULATORY_CARE_PROVIDER_SITE_OTHER): Payer: Medicare Other | Admitting: Internal Medicine

## 2013-12-17 ENCOUNTER — Encounter: Payer: Self-pay | Admitting: Internal Medicine

## 2013-12-17 VITALS — BP 160/88 | HR 87 | Temp 98.2°F | Resp 20 | Ht 63.5 in | Wt 126.0 lb

## 2013-12-17 DIAGNOSIS — F09 Unspecified mental disorder due to known physiological condition: Secondary | ICD-10-CM

## 2013-12-17 DIAGNOSIS — E119 Type 2 diabetes mellitus without complications: Secondary | ICD-10-CM

## 2013-12-17 DIAGNOSIS — F411 Generalized anxiety disorder: Secondary | ICD-10-CM

## 2013-12-17 DIAGNOSIS — Z23 Encounter for immunization: Secondary | ICD-10-CM

## 2013-12-17 DIAGNOSIS — I10 Essential (primary) hypertension: Secondary | ICD-10-CM

## 2013-12-17 DIAGNOSIS — R4189 Other symptoms and signs involving cognitive functions and awareness: Secondary | ICD-10-CM

## 2013-12-17 NOTE — Progress Notes (Signed)
Pre visit review using our clinic review tool, if applicable. No additional management support is needed unless otherwise documented below in the visit note. 

## 2013-12-17 NOTE — Progress Notes (Signed)
Subjective:    Patient ID: Kathy Beltran, female    DOB: 11-19-1927, 80 y.o.   MRN: [id number]  HPI  Wt Readings from Last 3 Encounters:  12/17/13 126 lb (57.153 kg)  11/05/13 130 lb (58.968 kg)  08/04/13 129 lb (58.40 kg)   80 year old patient who has treated hypertension.  She is seen for a six-week followup.  Do to a poorly controlled hypertension.  Hydrochlorothiazide was added to her regimen 6 weeks ago.  She seems to tolerate this well  She has chronic insomnia and anxiety.  She is accompanied by her son today who is concerned about her poor sleep habits.  She does use lorazepam at bedtime.  There have also been some concerns about memory, especially when she sleeps poorly  MMSE performed with a score of 21 out of 30  Other complaints include poor appetite and some stomach upset.  There has been some modest weight loss  Past Medical History  Diagnosis Date  . DIABETES MELLITUS, TYPE II 12/19/2006  . ANXIETY 12/19/2006  . HYPERTENSION 12/19/2006  . OSTEOARTHRITIS 12/19/2006    History   Social History  . Marital Status: Married    Spouse Name: N/A    Number of Children: N/A  . Years of Education: N/A   Occupational History  . Not on file.   Social History Main Topics  . Smoking status: Former Research scientist (life sciences)  . Smokeless tobacco: Never Used  . Alcohol Use: No  . Drug Use: No  . Sexual Activity: Not on file   Other Topics Concern  . Not on file   Social History Narrative  . No narrative on file    Past Surgical History  Procedure Laterality Date  . Hip open reduction      hip fx    Family History  Problem Relation Age of Onset  . Cancer Mother     renal ca - status post nephrectomy  . Diabetes Mother   . Cancer Sister     died of pancreatic ca  . COPD Sister   . Stroke Sister   . Cancer Brother     died of pancreatic ca  . COPD Brother     No Known Allergies  Current Outpatient Prescriptions on File Prior to Visit  Medication Sig Dispense Refill  .  amLODipine (NORVASC) 2.5 MG tablet TAKE ONE TABLET BY MOUTH ONCE DAILY  90 tablet  1  . atorvastatin (LIPITOR) 20 MG tablet Take 1 tablet (20 mg total) by mouth daily.  90 tablet  3  . benazepril-hydrochlorthiazide (LOTENSIN HCT) 20-12.5 MG per tablet Take 1 tablet by mouth daily.  90 tablet  3  . LORazepam (ATIVAN) 0.5 MG tablet TAKE ONE TABLET BY MOUTH TWICE DAILY AS NEEDED FOR ANXIETY  60 tablet  2  . metFORMIN (GLUCOPHAGE-XR) 500 MG 24 hr tablet TAKE ONE TABLET BY MOUTH DAILY WITH  BREAKFAST  90 tablet  6   No current facility-administered medications on file prior to visit.    BP 160/88  Pulse 87  Temp(Src) 98.2 F (36.8 C) (Oral)  Resp 20  Ht 5' 3.5" (1.613 m)  Wt 126 lb (57.153 kg)  BMI 21.97 kg/m2  SpO2 98%      Review of Systems  Constitutional: Positive for appetite change.  HENT: Negative for congestion, dental problem, hearing loss, rhinorrhea, sinus pressure, sore throat and tinnitus.   Eyes: Negative for pain, discharge and visual disturbance.  Respiratory: Negative for cough and shortness of breath.  Cardiovascular: Negative for chest pain, palpitations and leg swelling.  Gastrointestinal: Negative for nausea, vomiting, abdominal pain, diarrhea, constipation, blood in stool and abdominal distention.  Genitourinary: Negative for dysuria, urgency, frequency, hematuria, flank pain, vaginal bleeding, vaginal discharge, difficulty urinating, vaginal pain and pelvic pain.  Musculoskeletal: Negative for arthralgias, gait problem and joint swelling.  Skin: Negative for rash.  Neurological: Negative for dizziness, syncope, speech difficulty, weakness, numbness and headaches.  Hematological: Negative for adenopathy.  Psychiatric/Behavioral: Positive for sleep disturbance. Negative for behavioral problems, dysphoric mood and agitation. The patient is nervous/anxious.        Objective:   Physical Exam  Constitutional: She is oriented to person, place, and time. She  appears well-developed and well-nourished.  Blood pressure 152/70  HENT:  Head: Normocephalic.  Right Ear: External ear normal.  Left Ear: External ear normal.  Mouth/Throat: Oropharynx is clear and moist.  Eyes: Conjunctivae and EOM are normal. Pupils are equal, round, and reactive to light.  Neck: Normal range of motion. Neck supple. No thyromegaly present.  Cardiovascular: Normal rate, regular rhythm, normal heart sounds and intact distal pulses.   Pulmonary/Chest: Effort normal and breath sounds normal.  Abdominal: Soft. Bowel sounds are normal. She exhibits no mass. There is no tenderness.  Musculoskeletal: Normal range of motion.  Lymphadenopathy:    She has no cervical adenopathy.  Neurological: She is alert and oriented to person, place, and time.  Skin: Skin is warm and dry. No rash noted.  Psychiatric: She has a normal mood and affect. Her behavior is normal.  MMSE 21/30          Assessment & Plan:    Hypertension improved continue same Diabetes mellitus Mild to moderate impaired.  Cognition  Recheck 3 months Return when necessary if poor appetite persists or there is ongoing weight loss Discussed with son, who is very attentive

## 2014-02-02 ENCOUNTER — Other Ambulatory Visit: Payer: Self-pay | Admitting: Internal Medicine

## 2014-03-18 ENCOUNTER — Encounter: Payer: Self-pay | Admitting: Internal Medicine

## 2014-03-18 ENCOUNTER — Ambulatory Visit (INDEPENDENT_AMBULATORY_CARE_PROVIDER_SITE_OTHER): Payer: Medicare Other | Admitting: Internal Medicine

## 2014-03-18 ENCOUNTER — Encounter: Payer: Self-pay | Admitting: *Deleted

## 2014-03-18 VITALS — BP 140/70 | HR 76 | Temp 97.9°F | Resp 20 | Ht 63.5 in | Wt 129.0 lb

## 2014-03-18 DIAGNOSIS — M150 Primary generalized (osteo)arthritis: Secondary | ICD-10-CM

## 2014-03-18 DIAGNOSIS — Z23 Encounter for immunization: Secondary | ICD-10-CM

## 2014-03-18 DIAGNOSIS — E0840 Diabetes mellitus due to underlying condition with diabetic neuropathy, unspecified: Secondary | ICD-10-CM

## 2014-03-18 DIAGNOSIS — I10 Essential (primary) hypertension: Secondary | ICD-10-CM

## 2014-03-18 DIAGNOSIS — E041 Nontoxic single thyroid nodule: Secondary | ICD-10-CM

## 2014-03-18 DIAGNOSIS — M159 Polyosteoarthritis, unspecified: Secondary | ICD-10-CM

## 2014-03-18 LAB — MICROALBUMIN / CREATININE URINE RATIO
Creatinine,U: 83.2 mg/dL
MICROALB UR: 9.6 mg/dL — AB (ref 0.0–1.9)
Microalb Creat Ratio: 11.5 mg/g (ref 0.0–30.0)

## 2014-03-18 LAB — HEMOGLOBIN A1C: Hgb A1c MFr Bld: 7.2 % — ABNORMAL HIGH (ref 4.6–6.5)

## 2014-03-18 MED ORDER — LORAZEPAM 0.5 MG PO TABS: ORAL_TABLET | ORAL | Status: DC

## 2014-03-18 NOTE — Progress Notes (Signed)
Subjective:    Patient ID: Kathy Beltran, female    DOB: December 11, 1927, 80 y.o.   MRN: [id number]  HPI 80 year old patient who has type 2 diabetes.  She is accompanied by her son.  3 months ago.  There were some concerns about cognitive impairment and MMSE score 21/30.  He feels this has improved She has been followed closely by Dr. Willis Modena due to macular degeneration. She has hypertension and dyslipidemia. No cardiac symptoms Remains on statin therapy.  Past Medical History  Diagnosis Date  . DIABETES MELLITUS, TYPE II 12/19/2006  . ANXIETY 12/19/2006  . HYPERTENSION 12/19/2006  . OSTEOARTHRITIS 12/19/2006    History   Social History  . Marital Status: Married    Spouse Name: N/A    Number of Children: N/A  . Years of Education: N/A   Occupational History  . Not on file.   Social History Main Topics  . Smoking status: Former Research scientist (life sciences)  . Smokeless tobacco: Never Used  . Alcohol Use: No  . Drug Use: No  . Sexual Activity: Not on file   Other Topics Concern  . Not on file   Social History Narrative    Past Surgical History  Procedure Laterality Date  . Hip open reduction      hip fx    Family History  Problem Relation Age of Onset  . Cancer Mother     renal ca - status post nephrectomy  . Diabetes Mother   . Cancer Sister     died of pancreatic ca  . COPD Sister   . Stroke Sister   . Cancer Brother     died of pancreatic ca  . COPD Brother     No Known Allergies  Current Outpatient Prescriptions on File Prior to Visit  Medication Sig Dispense Refill  . amLODipine (NORVASC) 2.5 MG tablet TAKE ONE TABLET BY MOUTH ONCE DAILY 90 tablet 1  . atorvastatin (LIPITOR) 20 MG tablet Take 1 tablet (20 mg total) by mouth daily. 90 tablet 3  . benazepril-hydrochlorthiazide (LOTENSIN HCT) 20-12.5 MG per tablet Take 1 tablet by mouth daily. 90 tablet 3  . metFORMIN (GLUCOPHAGE-XR) 500 MG 24 hr tablet TAKE ONE TABLET BY MOUTH DAILY WITH  BREAKFAST 90 tablet 6   No current  facility-administered medications on file prior to visit.    BP 140/70 mmHg  Pulse 76  Temp(Src) 97.9 F (36.6 C) (Oral)  Resp 20  Ht 5' 3.5" (1.613 m)  Wt 129 lb (58.514 kg)  BMI 22.49 kg/m2  SpO2 98%      Review of Systems  Constitutional: Positive for appetite change and fatigue.  HENT: Negative for congestion, dental problem, hearing loss, rhinorrhea, sinus pressure, sore throat and tinnitus.   Eyes: Negative for pain, discharge and visual disturbance.  Respiratory: Negative for cough and shortness of breath.   Cardiovascular: Negative for chest pain, palpitations and leg swelling.  Gastrointestinal: Negative for nausea, vomiting, abdominal pain, diarrhea, constipation, blood in stool and abdominal distention.  Genitourinary: Negative for dysuria, urgency, frequency, hematuria, flank pain, vaginal bleeding, vaginal discharge, difficulty urinating, vaginal pain and pelvic pain.  Musculoskeletal: Negative for joint swelling, arthralgias and gait problem.  Skin: Negative for rash.  Neurological: Negative for dizziness, syncope, speech difficulty, weakness, numbness and headaches.  Hematological: Negative for adenopathy.  Psychiatric/Behavioral: Positive for confusion and decreased concentration. Negative for behavioral problems, dysphoric mood and agitation. The patient is not nervous/anxious.        Objective:   Physical  Exam  Constitutional: She is oriented to person, place, and time. She appears well-developed and well-nourished.  HENT:  Head: Normocephalic.  Right Ear: External ear normal.  Left Ear: External ear normal.  Mouth/Throat: Oropharynx is clear and moist.  Eyes: Conjunctivae and EOM are normal. Pupils are equal, round, and reactive to light.  Neck: Normal range of motion. Neck supple. Thyromegaly present.  Prominent right thyroid nodule  Cardiovascular: Normal rate, regular rhythm, normal heart sounds and intact distal pulses.   Pulmonary/Chest: Effort  normal and breath sounds normal.  Abdominal: Soft. Bowel sounds are normal. She exhibits no mass. There is no tenderness.  Musculoskeletal: Normal range of motion.  Lymphadenopathy:    She has no cervical adenopathy.  Neurological: She is alert and oriented to person, place, and time.  Skin: Skin is warm and dry. No rash noted.  Psychiatric: She has a normal mood and affect. Her behavior is normal.          Assessment & Plan:   Diabetes mellitus.  We'll check a hemoglobin A1c and urine for microalbumin Hypertension, stable Dyslipidemia.  Continue statin therapy Thyromegaly with prominent right thyroid nodule.  We'll check a thyroid ultrasound  Recheck 6 months

## 2014-03-18 NOTE — Patient Instructions (Signed)
Limit your sodium (Salt) intake    It is important that you exercise regularly, at least 20 minutes 3 to 4 times per week.  If you develop chest pain or shortness of breath seek  medical attention.  Return in 6 months for follow-up  

## 2014-03-18 NOTE — Progress Notes (Signed)
   Subjective:    Patient ID: Kathy Beltran, female    DOB: [birth year], 80 y.o.   MRN: [id number]  HPI   Wt Readings from Last 3 Encounters:  03/18/14 129 lb (58.514 kg)  12/17/13 126 lb (57.153 kg)  11/05/13 130 lb (58.968 kg)   Review of Systems     Objective:   Physical Exam        Assessment & Plan:

## 2014-03-18 NOTE — Progress Notes (Signed)
Pre visit review using our clinic review tool, if applicable. No additional management support is needed unless otherwise documented below in the visit note. 

## 2014-03-19 ENCOUNTER — Ambulatory Visit: Payer: Medicare Other | Admitting: Internal Medicine

## 2014-03-23 ENCOUNTER — Ambulatory Visit (HOSPITAL_COMMUNITY): Payer: Medicare Other

## 2014-03-25 ENCOUNTER — Ambulatory Visit
Admission: RE | Admit: 2014-03-25 | Discharge: 2014-03-25 | Disposition: A | Discharge status: Home / Self Care | Payer: Medicare Other | Source: Ambulatory Visit | Attending: Internal Medicine | Admitting: Internal Medicine

## 2014-03-25 DIAGNOSIS — E041 Nontoxic single thyroid nodule: Secondary | ICD-10-CM

## 2014-03-26 ENCOUNTER — Other Ambulatory Visit: Payer: Self-pay | Admitting: Internal Medicine

## 2014-03-26 DIAGNOSIS — E041 Nontoxic single thyroid nodule: Secondary | ICD-10-CM

## 2014-04-28 ENCOUNTER — Other Ambulatory Visit: Payer: Self-pay | Admitting: Internal Medicine

## 2014-05-18 ENCOUNTER — Telehealth: Payer: Self-pay | Admitting: Internal Medicine

## 2014-05-18 NOTE — Telephone Encounter (Signed)
Pt has appt to see PCP on 04/19/14.

## 2014-05-18 NOTE — Telephone Encounter (Signed)
Appt is on 05/20/2014 with Dr. Raliegh Ip. Noted

## 2014-05-18 NOTE — Telephone Encounter (Signed)
PLEASE NOTE: All timestamps contained within this report are represented as [place] Standard Time. CONFIDENTIALTY NOTICE: This fax transmission is intended only for the addressee. It contains information that is legally privileged, confidential or otherwise protected from use or disclosure. If you are not the intended recipient, you are strictly prohibited from reviewing, disclosing, copying using or disseminating any of this information or taking any action in reliance on or regarding this information. If you have received this fax in error, please notify us immediately by telephone so that we can arrange for its return to [place]. Phone: [phone], Toll-Free: [phone], Fax: [phone] Page: 1 of 2 Call Id: [id number] [facility] [place] Patient Name: Kathy Beltran Gender: Female DOB: 1927-11-27 Age: 81 Y 9 M 6 D Return Phone Number: [phone] (Primary) Address: [address] City/State/Zip: [place] [address] Client [facility] - Client Client Site [place] - Day Physician Simonne Martinet Contact Type Call Call Type Triage / Clinical Caller Name Vicente Serene Relationship To Patient Son Return Phone Number [phone] (Primary) Chief Complaint Leg Swelling And Edema Initial Comment caller states mother has swelling in feet and legs and thinks its from generic Lipitor, has constipation PreDisposition Did not know what to do Nurse Assessment Nurse: Kenton Kingfisher, RN, Meagan Date/Time (Eastern Time): 05/18/2014 11:12:03 AM Confirm and document reason for call. If symptomatic, describe symptoms. ---caller states mother has swelling in feet and legs and thinks its from generic Lipitor, has constipation. Caller states he would like her to go to the MD but she did not want to. Son states swelling has been going on for awhile but has gotten worse. States  mother stopped taking the generic lipitor since last Thursday. Caller states mother stopped medication on her own. Has the patient traveled out of the country within the last 30 days? ---Not Applicable Does the patient require triage? ---Yes Related visit to physician within the last 2 weeks? ---No Does the PT have any chronic conditions? (i.e. diabetes, asthma, etc.) ---Yes List chronic conditions. ---hypertension hyperlipidemia diabetic Guidelines Guideline Title Affirmed Question Affirmed Notes Nurse Date/Time Eilene Ghazi Time) Leg Swelling and Edema [1] MODERATE leg swelling (e.g., swelling extends up to knees) AND [2] new onset or worsening Kenton Kingfisher, RN, Meagan 05/18/2014 11:14:59 AM Disp. Time Eilene Ghazi Time) Disposition Final User 05/18/2014 11:05:10 AM Send To Clinical Follow Up Jeral Fruit 05/18/2014 11:21:00 AM See Physician within 24 Hours Yes Kenton Kingfisher, RN, Meagan PLEASE NOTE: All timestamps contained within this report are represented as [place] Standard Time. CONFIDENTIALTY NOTICE: This fax transmission is intended only for the addressee. It contains information that is legally privileged, confidential or otherwise protected from use or disclosure. If you are not the intended recipient, you are strictly prohibited from reviewing, disclosing, copying using or disseminating any of this information or taking any action in reliance on or regarding this information. If you have received this fax in error, please notify us immediately by telephone so that we can arrange for its return to [place]. Phone: [phone], Toll-Free: [phone], Fax: [phone] Page: 2 of 2 Call Id: [id number] Caller Understands: Yes Disagree/Comply: Comply Care Advice Given Per Guideline SEE PHYSICIAN WITHIN 24 HOURS: * IF OFFICE WILL BE OPEN: You need to be examined within the next 24 hours. Call your doctor when the office opens, and make an appointment. LEG SWELLING AND/OR EDEMA: * Elevate your legs  or try to lay down once or twice daily for 20  minutes. * Avoid socks with an elastic band at the top. Wear comfortable shoes. CALL BACK IF: * Breathing difficulty or chest pain occurs * You become worse. CARE ADVICE given per Leg Swelling and Edema (Adult) guideline. After Care Instructions Given Call Event Type User Date / Time Description Comments User: Dayton Martes, RN Date/Time Eilene Ghazi Time): 05/18/2014 11:29:10 AM [phone] Called back line spoke Estill Bamberg and next appt is not until May. Appt on Thursday at 9 am if they can do that. Caller was warm transferred to speak with Estill Bamberg. Referrals GO TO FACILITY UNDECIDED

## 2014-05-20 ENCOUNTER — Ambulatory Visit: Payer: Self-pay | Admitting: Internal Medicine

## 2014-08-02 ENCOUNTER — Other Ambulatory Visit: Payer: Self-pay | Admitting: Internal Medicine

## 2014-09-16 ENCOUNTER — Ambulatory Visit (INDEPENDENT_AMBULATORY_CARE_PROVIDER_SITE_OTHER): Payer: Medicare Other | Admitting: Internal Medicine

## 2014-09-16 ENCOUNTER — Encounter: Payer: Self-pay | Admitting: Internal Medicine

## 2014-09-16 VITALS — BP 130/64 | HR 78 | Temp 97.7°F | Resp 20 | Ht 63.5 in | Wt 126.0 lb

## 2014-09-16 DIAGNOSIS — M159 Polyosteoarthritis, unspecified: Secondary | ICD-10-CM

## 2014-09-16 DIAGNOSIS — I10 Essential (primary) hypertension: Secondary | ICD-10-CM | POA: Diagnosis not present

## 2014-09-16 DIAGNOSIS — F411 Generalized anxiety disorder: Secondary | ICD-10-CM | POA: Diagnosis not present

## 2014-09-16 DIAGNOSIS — M150 Primary generalized (osteo)arthritis: Secondary | ICD-10-CM

## 2014-09-16 DIAGNOSIS — E0840 Diabetes mellitus due to underlying condition with diabetic neuropathy, unspecified: Secondary | ICD-10-CM | POA: Diagnosis not present

## 2014-09-16 LAB — HEMOGLOBIN A1C: HEMOGLOBIN A1C: 7.2 % — AB (ref 4.6–6.5)

## 2014-09-16 MED ORDER — LORAZEPAM 0.5 MG PO TABS: ORAL_TABLET | ORAL | Status: DC

## 2014-09-16 NOTE — Patient Instructions (Signed)
Limit your sodium (Salt) intake  Return in 6 months for follow-up  

## 2014-09-16 NOTE — Progress Notes (Signed)
   Subjective:    Patient ID: Kathy Beltran, female    DOB: 12/08/1927, 81 y.o.   MRN: [id number]  HPI  Wt Readings from Last 3 Encounters:  09/16/14 126 lb (57.153 kg)  03/18/14 129 lb (58.514 kg)  12/17/13 126 lb (57.153 kg)   Lab Results  Component Value Date   HGBA1C 7.2* 03/18/2014    Review of Systems     Objective:   Physical Exam        Assessment & Plan:

## 2014-09-16 NOTE — Progress Notes (Signed)
Pre visit review using our clinic review tool, if applicable. No additional management support is needed unless otherwise documented below in the visit note. 

## 2014-09-16 NOTE — Progress Notes (Signed)
Subjective:    Patient ID: Kathy Beltran, female    DOB: [birth year], 81 y.o.   MRN: [id number]  HPI   81 year old patient who is seen today for follow-up of type 2 diabetes.  She has been on metformin therapy.  Last hemoglobin A1c 7.2.  Generally doing fairly well On complaint today is a rash that she's had for the past several weeks involving her upper and mid back area She has treated hypertension.  Apparently has not been taking low-dose amlodipine Remains on atorvastatin for dyslipidemia. She does have a history of mild impaired cognition.  No new concerns or complaints. She does have a history of a large right thyroid nodule.  This was evaluated by ultrasound, but she declined biopsy  Past Medical History  Diagnosis Date  . DIABETES MELLITUS, TYPE II 12/19/2006  . ANXIETY 12/19/2006  . HYPERTENSION 12/19/2006  . OSTEOARTHRITIS 12/19/2006    History   Social History  . Marital Status: Married    Spouse Name: N/A  . Number of Children: N/A  . Years of Education: N/A   Occupational History  . Not on file.   Social History Main Topics  . Smoking status: Former Research scientist (life sciences)  . Smokeless tobacco: Never Used  . Alcohol Use: No  . Drug Use: No  . Sexual Activity: Not on file   Other Topics Concern  . Not on file   Social History Narrative    Past Surgical History  Procedure Laterality Date  . Hip open reduction      hip fx    Family History  Problem Relation Age of Onset  . Cancer Mother     renal ca - status post nephrectomy  . Diabetes Mother   . Cancer Sister     died of pancreatic ca  . COPD Sister   . Stroke Sister   . Cancer Brother     died of pancreatic ca  . COPD Brother     No Known Allergies  Current Outpatient Prescriptions on File Prior to Visit  Medication Sig Dispense Refill  . atorvastatin (LIPITOR) 20 MG tablet TAKE ONE TABLET BY MOUTH ONCE DAILY 90 tablet 1  . benazepril-hydrochlorthiazide (LOTENSIN HCT) 20-12.5 MG per tablet Take 1 tablet  by mouth daily. 90 tablet 3  . LORazepam (ATIVAN) 0.5 MG tablet TAKE ONE TABLET BY MOUTH TWICE DAILY AS NEEDED FOR ANXIETY 60 tablet 5  . metFORMIN (GLUCOPHAGE-XR) 500 MG 24 hr tablet TAKE ONE TABLET BY MOUTH ONCE DAILY WITH  BREAKFAST 90 tablet 0  . amLODipine (NORVASC) 2.5 MG tablet TAKE ONE TABLET BY MOUTH ONCE DAILY (Patient not taking: Reported on 09/16/2014) 90 tablet 1   No current facility-administered medications on file prior to visit.    BP 130/64 mmHg  Pulse 78  Temp(Src) 97.7 F (36.5 C) (Oral)  Resp 20  Ht 5' 3.5" (1.613 m)  Wt 126 lb (57.153 kg)  BMI 21.97 kg/m2  SpO2 98%     Review of Systems  Constitutional: Negative.   HENT: Negative for congestion, dental problem, hearing loss, rhinorrhea, sinus pressure, sore throat and tinnitus.   Eyes: Positive for visual disturbance. Negative for pain and discharge.  Respiratory: Negative for cough and shortness of breath.   Cardiovascular: Negative for chest pain, palpitations and leg swelling.  Gastrointestinal: Negative for nausea, vomiting, abdominal pain, diarrhea, constipation, blood in stool and abdominal distention.  Genitourinary: Negative for dysuria, urgency, frequency, hematuria, flank pain, vaginal bleeding, vaginal discharge, difficulty urinating, vaginal pain  and pelvic pain.  Musculoskeletal: Negative for joint swelling, arthralgias and gait problem.  Skin: Positive for rash.  Neurological: Negative for dizziness, syncope, speech difficulty, weakness, numbness and headaches.  Hematological: Negative for adenopathy.  Psychiatric/Behavioral: Negative for behavioral problems, dysphoric mood and agitation. The patient is not nervous/anxious.        Objective:   Physical Exam  Constitutional: She is oriented to person, place, and time. She appears well-developed and well-nourished.  HENT:  Head: Normocephalic.  Right Ear: External ear normal.  Left Ear: External ear normal.  Mouth/Throat: Oropharynx is clear  and moist.  Eyes: Conjunctivae and EOM are normal. Pupils are equal, round, and reactive to light.  Neck: Normal range of motion. Neck supple. No thyromegaly present.  Large right thyroid nodule  Cardiovascular: Normal rate, regular rhythm, normal heart sounds and intact distal pulses.   Pulmonary/Chest: Effort normal and breath sounds normal.  Kyphosis  Abdominal: Soft. Bowel sounds are normal. She exhibits no mass. There is no tenderness.  Musculoskeletal: Normal range of motion.  Lymphadenopathy:    She has no cervical adenopathy.  Neurological: She is alert and oriented to person, place, and time.  Skin: Skin is warm and dry. Rash noted.  Dry flaky skin involving the upper and mid back area  Psychiatric: She has a normal mood and affect. Her behavior is normal.          Assessment & Plan:   Diabetes mellitus.  Will check a hemoglobin A1c.  Continue metformin therapy Hypertension.  Reasonable control.  Will discontinue amlodipine.  She has not been taking this medication Dyslipidemia.  Continue atorvastatin Macular degeneration.  Follow-up ophthalmology  CPX 6 months

## 2014-10-27 ENCOUNTER — Other Ambulatory Visit: Payer: Self-pay | Admitting: Internal Medicine

## 2015-01-10 ENCOUNTER — Emergency Department (HOSPITAL_BASED_OUTPATIENT_CLINIC_OR_DEPARTMENT_OTHER): Payer: Medicare Other

## 2015-01-10 ENCOUNTER — Emergency Department (HOSPITAL_BASED_OUTPATIENT_CLINIC_OR_DEPARTMENT_OTHER)
Admission: EM | Admit: 2015-01-10 | Discharge: 2015-01-10 | Disposition: A | Discharge status: Home / Self Care | Payer: Medicare Other | Attending: Emergency Medicine | Admitting: Emergency Medicine

## 2015-01-10 ENCOUNTER — Encounter (HOSPITAL_BASED_OUTPATIENT_CLINIC_OR_DEPARTMENT_OTHER): Payer: Self-pay | Admitting: *Deleted

## 2015-01-10 DIAGNOSIS — Y9389 Activity, other specified: Secondary | ICD-10-CM | POA: Diagnosis not present

## 2015-01-10 DIAGNOSIS — M1990 Unspecified osteoarthritis, unspecified site: Secondary | ICD-10-CM | POA: Insufficient documentation

## 2015-01-10 DIAGNOSIS — Z87891 Personal history of nicotine dependence: Secondary | ICD-10-CM | POA: Insufficient documentation

## 2015-01-10 DIAGNOSIS — Y9289 Other specified places as the place of occurrence of the external cause: Secondary | ICD-10-CM | POA: Insufficient documentation

## 2015-01-10 DIAGNOSIS — S62101A Fracture of unspecified carpal bone, right wrist, initial encounter for closed fracture: Secondary | ICD-10-CM

## 2015-01-10 DIAGNOSIS — S52571A Other intraarticular fracture of lower end of right radius, initial encounter for closed fracture: Secondary | ICD-10-CM | POA: Insufficient documentation

## 2015-01-10 DIAGNOSIS — Z79899 Other long term (current) drug therapy: Secondary | ICD-10-CM | POA: Insufficient documentation

## 2015-01-10 DIAGNOSIS — E119 Type 2 diabetes mellitus without complications: Secondary | ICD-10-CM | POA: Diagnosis not present

## 2015-01-10 DIAGNOSIS — F419 Anxiety disorder, unspecified: Secondary | ICD-10-CM | POA: Insufficient documentation

## 2015-01-10 DIAGNOSIS — S6991XA Unspecified injury of right wrist, hand and finger(s), initial encounter: Secondary | ICD-10-CM | POA: Diagnosis present

## 2015-01-10 DIAGNOSIS — W010XXA Fall on same level from slipping, tripping and stumbling without subsequent striking against object, initial encounter: Secondary | ICD-10-CM | POA: Insufficient documentation

## 2015-01-10 DIAGNOSIS — I10 Essential (primary) hypertension: Secondary | ICD-10-CM | POA: Insufficient documentation

## 2015-01-10 DIAGNOSIS — Y998 Other external cause status: Secondary | ICD-10-CM | POA: Diagnosis not present

## 2015-01-10 MED ORDER — HYDROCODONE-ACETAMINOPHEN 5-325 MG PO TABS
1.0000 | ORAL_TABLET | Freq: Once | ORAL | Status: AC
  Administered 2015-01-10: 1 via ORAL
  Filled 2015-01-10: qty 1

## 2015-01-10 MED ORDER — HYDROCODONE-ACETAMINOPHEN 5-325 MG PO TABS: 1.0000 | ORAL_TABLET | Freq: Once | ORAL | Status: DC

## 2015-01-10 NOTE — ED Provider Notes (Signed)
CSN: [id number]     Arrival date & time 01/10/15  1039 History   First MD Initiated Contact with Patient 01/10/15 1115     Chief Complaint  Patient presents with  . Fall  . Wrist Injury     (Consider location/radiation/quality/duration/timing/severity/associated sxs/prior Treatment) HPI OSA Kathy Beltran is a 81 y.o. female with a history of hypertension, diabetes, osteoarthritis, comes in for evaluation of a fall and right wrist injury. Patient states at approximately 6:00 this morning she was taking the trash out when she tripped, fell forward and landed on her right wrist. She did not do anything for her symptoms at that time. She reports gradually worsening pain in her right wrist with associated swelling. Reports pain is moderate and intermittent in nature, worse with movement and palpation. She denies any numbness or weakness in her hands. No cool extremities. Patient reports she last ate a boost milkshake at 7:00 this morning. No anticoagulation  Past Medical History  Diagnosis Date  . DIABETES MELLITUS, TYPE II 12/19/2006  . ANXIETY 12/19/2006  . HYPERTENSION 12/19/2006  . OSTEOARTHRITIS 12/19/2006   Past Surgical History  Procedure Laterality Date  . Hip open reduction      hip fx   Family History  Problem Relation Age of Onset  . Cancer Mother     renal ca - status post nephrectomy  . Diabetes Mother   . Cancer Sister     died of pancreatic ca  . COPD Sister   . Stroke Sister   . Cancer Brother     died of pancreatic ca  . COPD Brother    Social History  Substance Use Topics  . Smoking status: Former Research scientist (life sciences)  . Smokeless tobacco: Never Used  . Alcohol Use: No   OB History    No data available     Review of Systems A 10 point review of systems was completed and was negative except for pertinent positives and negatives as mentioned in the history of present illness     Allergies  Review of patient's allergies indicates no known allergies.  Home Medications    Prior to Admission medications   Medication Sig Start Date End Date Taking? Authorizing Provider  atorvastatin (LIPITOR) 20 MG tablet TAKE ONE TABLET BY MOUTH ONCE DAILY 10/28/14   Marletta Lor, MD  benazepril-hydrochlorthiazide (LOTENSIN HCT) 20-12.5 MG per tablet TAKE ONE TABLET BY MOUTH ONCE DAILY 10/28/14   Marletta Lor, MD  HYDROcodone-acetaminophen (NORCO/VICODIN) 5-325 MG per tablet Take 1 tablet by mouth once. 01/10/15   Comer Locket, PA-C  LORazepam (ATIVAN) 0.5 MG tablet TAKE ONE TABLET BY MOUTH TWICE DAILY AS NEEDED FOR ANXIETY 09/16/14   Marletta Lor, MD  metFORMIN (GLUCOPHAGE-XR) 500 MG 24 hr tablet TAKE ONE TABLET BY MOUTH ONCE DAILY WITH  BREAKFAST 10/28/14   Marletta Lor, MD   BP 159/62 mmHg  Pulse 63  Temp(Src) 97.5 F (36.4 C) (Oral)  Resp 16  Ht 5\' 6"  (1.676 m)  Wt 120 lb (54.432 kg)  BMI 19.38 kg/m2  SpO2 96% Physical Exam  Constitutional: She is oriented to person, place, and time. She appears well-developed and well-nourished.  HENT:  Head: Normocephalic and atraumatic.  Mouth/Throat: Oropharynx is clear and moist.  Eyes: Conjunctivae are normal. Pupils are equal, round, and reactive to light. Right eye exhibits no discharge. Left eye exhibits no discharge. No scleral icterus.  Neck: Neck supple.  Cardiovascular: Normal rate, regular rhythm and normal heart sounds.   Pulmonary/Chest:  Effort normal and breath sounds normal. No respiratory distress. She has no wheezes. She has no rales.  Abdominal: Soft. There is no tenderness.  Musculoskeletal: She exhibits no tenderness.  Diffuse swelling and ecchymosis noted to right wrist. Compartments remain soft. Radial pulse intact. Brisk cap refill. No elbow tenderness, full active range of motion of elbow. No other lesions or deformities.  Neurological: She is alert and oriented to person, place, and time.  Cranial Nerves II-XII grossly intact  Skin: Skin is warm and dry. No rash noted.   Psychiatric: She has a normal mood and affect.  Nursing note and vitals reviewed.   ED Course  Procedures (including critical care time) Labs Review Labs Reviewed - No data to display  Imaging Review Dg Wrist Complete Right  01/10/2015   CLINICAL DATA:  Status post fall this morning with right wrist pain and swelling.  EXAM: RIGHT WRIST - COMPLETE 3+ VIEW  COMPARISON:  None.  FINDINGS: There is comminuted displaced intra-articular fracture of distal radius. There is no dislocation.  IMPRESSION: Comminuted displaced intra-articular fracture of distal radius.   Electronically Signed   By: Abelardo Diesel M.D.   On: 01/10/2015 11:20   I have personally reviewed and evaluated these images and lab results as part of my medical decision-making.   EKG Interpretation None     Meds given in ED:  Medications  HYDROcodone-acetaminophen (NORCO/VICODIN) 5-325 MG per tablet 1 tablet (1 tablet Oral Given 01/10/15 1240)    New Prescriptions   HYDROCODONE-ACETAMINOPHEN (NORCO/VICODIN) 5-325 MG PER TABLET    Take 1 tablet by mouth once.   Filed Vitals:   01/10/15 1043 01/10/15 1312  BP: 149/48 159/62  Pulse: 79 63  Temp: 97.7 F (36.5 C) 97.5 F (36.4 C)  TempSrc: Oral Oral  Resp: 16   Height: 5\' 6"  (1.676 m)   Weight: 120 lb (54.432 kg)   SpO2: 100% 17%   SPLINT APPLICATION Date/Time: 5:10 PM Authorized by: Verl Dicker Consent: Verbal consent obtained. Risks and benefits: risks, benefits and alternatives were discussed Consent given by: patient Splint applied by: orthopedic technician Location details: Right arm  Splint type: Sugar tong  Supplies used: Ace wrap and splinting materials  Post-procedure: The splinted body part was neurovascularly unchanged following the procedure. Patient tolerance: Patient tolerated the procedure well with no immediate complications.    MDM  Vitals stable - afebrile Pt resting comfortably in ED. PE--patient with diffuse swelling to  right wrist. Remains neurovascularly intact. Imaging-plain films of right wrist show a comminuted displaced intra-articular fracture of distal radius.  Discussed with Dr. Bertis Ruddy nurse, recommends sugar tong splint, will see in the office in the morning at 9:00 AM. Will DC with pain medicines. Patient discharged in the care of her son. I discussed all relevant lab findings and imaging results with pt and they verbalized understanding. Discussed f/u with PCP within 48 hrs and return precautions, pt very amenable to plan. Prior to patient discharge, I discussed and reviewed this case with Dr. Tamera Punt, who also saw and evaluated the patient   Final diagnoses:  Broken wrist, right, closed, initial encounter        Comer Locket, PA-C 01/10/15 [place], MD 01/10/15 1348

## 2015-01-10 NOTE — Discharge Instructions (Signed)
You were evaluated in the ED today for your wrist pain after a fall. You were found to have a broken wrist. You will need to be reevaluated by Dr. Burney Gauze tomorrow. You have an appointment scheduled at 9:00 AM at his office, please arrive at 8:30 AM. Please take your pain medicines as prescribed. Return to ED for new or worsening symptoms.  Cast or Splint Care Casts and splints support injured limbs and keep bones from moving while they heal. It is important to care for your cast or splint at home.  HOME CARE INSTRUCTIONS  Keep the cast or splint uncovered during the drying period. It can take 24 to 48 hours to dry if it is made of plaster. A fiberglass cast will dry in less than 1 hour.  Do not rest the cast on anything harder than a pillow for the first 24 hours.  Do not put weight on your injured limb or apply pressure to the cast until your health care provider gives you permission.  Keep the cast or splint dry. Wet casts or splints can lose their shape and may not support the limb as well. A wet cast that has lost its shape can also create harmful pressure on your skin when it dries. Also, wet skin can become infected.  Cover the cast or splint with a plastic bag when bathing or when out in the rain or snow. If the cast is on the trunk of the body, take sponge baths until the cast is removed.  If your cast does become wet, dry it with a towel or a blow dryer on the cool setting only.  Keep your cast or splint clean. Soiled casts may be wiped with a moistened cloth.  Do not place any hard or soft foreign objects under your cast or splint, such as cotton, toilet paper, lotion, or powder.  Do not try to scratch the skin under the cast with any object. The object could get stuck inside the cast. Also, scratching could lead to an infection. If itching is a problem, use a blow dryer on a cool setting to relieve discomfort.  Do not trim or cut your cast or remove padding from inside of  it.  Exercise all joints next to the injury that are not immobilized by the cast or splint. For example, if you have a long leg cast, exercise the hip joint and toes. If you have an arm cast or splint, exercise the shoulder, elbow, thumb, and fingers.  Elevate your injured arm or leg on 1 or 2 pillows for the first 1 to 3 days to decrease swelling and pain.It is best if you can comfortably elevate your cast so it is higher than your heart. SEEK MEDICAL CARE IF:   Your cast or splint cracks.  Your cast or splint is too tight or too loose.  You have unbearable itching inside the cast.  Your cast becomes wet or develops a soft spot or area.  You have a bad smell coming from inside your cast.  You get an object stuck under your cast.  Your skin around the cast becomes red or raw.  You have new pain or worsening pain after the cast has been applied. SEEK IMMEDIATE MEDICAL CARE IF:   You have fluid leaking through the cast.  You are unable to move your fingers or toes.  You have discolored (blue or white), cool, painful, or very swollen fingers or toes beyond the cast.  You have tingling  or numbness around the injured area.  You have severe pain or pressure under the cast.  You have any difficulty with your breathing or have shortness of breath.  You have chest pain. Document Released: 04/27/2000 Document Revised: 02/18/2013 Document Reviewed: 11/06/2012 [facility] Patient Information 2015 [place], [place]. This information is not intended to replace advice given to you by your health care provider. Make sure you discuss any questions you have with your health care provider.

## 2015-01-10 NOTE — ED Notes (Signed)
She was taking out the trash this am, tripped and fell. Injury to her right wrist. Deformity noted with swelling to her forearm. Radial pulse palpated.

## 2015-01-13 ENCOUNTER — Ambulatory Visit (INDEPENDENT_AMBULATORY_CARE_PROVIDER_SITE_OTHER): Payer: Medicare Other | Admitting: Internal Medicine

## 2015-01-13 ENCOUNTER — Encounter (HOSPITAL_COMMUNITY): Payer: Self-pay | Admitting: Emergency Medicine

## 2015-01-13 ENCOUNTER — Encounter: Payer: Self-pay | Admitting: Internal Medicine

## 2015-01-13 ENCOUNTER — Inpatient Hospital Stay (HOSPITAL_COMMUNITY)
Admission: EM | Admit: 2015-01-13 | Discharge: 2015-01-14 | DRG: 812 | Disposition: A | Discharge status: Home / Self Care | Payer: Medicare Other | Attending: Internal Medicine | Admitting: Internal Medicine

## 2015-01-13 VITALS — BP 110/76 | Temp 97.9°F | Ht 66.0 in | Wt 123.0 lb

## 2015-01-13 DIAGNOSIS — R4189 Other symptoms and signs involving cognitive functions and awareness: Secondary | ICD-10-CM

## 2015-01-13 DIAGNOSIS — E440 Moderate protein-calorie malnutrition: Secondary | ICD-10-CM | POA: Insufficient documentation

## 2015-01-13 DIAGNOSIS — F419 Anxiety disorder, unspecified: Secondary | ICD-10-CM | POA: Diagnosis present

## 2015-01-13 DIAGNOSIS — Z825 Family history of asthma and other chronic lower respiratory diseases: Secondary | ICD-10-CM | POA: Diagnosis not present

## 2015-01-13 DIAGNOSIS — D649 Anemia, unspecified: Secondary | ICD-10-CM

## 2015-01-13 DIAGNOSIS — I10 Essential (primary) hypertension: Secondary | ICD-10-CM

## 2015-01-13 DIAGNOSIS — R630 Anorexia: Secondary | ICD-10-CM

## 2015-01-13 DIAGNOSIS — M1990 Unspecified osteoarthritis, unspecified site: Secondary | ICD-10-CM | POA: Diagnosis present

## 2015-01-13 DIAGNOSIS — Z833 Family history of diabetes mellitus: Secondary | ICD-10-CM | POA: Diagnosis not present

## 2015-01-13 DIAGNOSIS — Z79891 Long term (current) use of opiate analgesic: Secondary | ICD-10-CM | POA: Diagnosis not present

## 2015-01-13 DIAGNOSIS — E871 Hypo-osmolality and hyponatremia: Secondary | ICD-10-CM | POA: Diagnosis present

## 2015-01-13 DIAGNOSIS — Z681 Body mass index (BMI) 19 or less, adult: Secondary | ICD-10-CM | POA: Diagnosis not present

## 2015-01-13 DIAGNOSIS — R195 Other fecal abnormalities: Secondary | ICD-10-CM

## 2015-01-13 DIAGNOSIS — Z800 Family history of malignant neoplasm of digestive organs: Secondary | ICD-10-CM

## 2015-01-13 DIAGNOSIS — K921 Melena: Secondary | ICD-10-CM | POA: Diagnosis present

## 2015-01-13 DIAGNOSIS — Z823 Family history of stroke: Secondary | ICD-10-CM | POA: Diagnosis not present

## 2015-01-13 DIAGNOSIS — Z79899 Other long term (current) drug therapy: Secondary | ICD-10-CM | POA: Diagnosis not present

## 2015-01-13 DIAGNOSIS — Z87891 Personal history of nicotine dependence: Secondary | ICD-10-CM

## 2015-01-13 DIAGNOSIS — E0842 Diabetes mellitus due to underlying condition with diabetic polyneuropathy: Secondary | ICD-10-CM

## 2015-01-13 DIAGNOSIS — D500 Iron deficiency anemia secondary to blood loss (chronic): Secondary | ICD-10-CM

## 2015-01-13 DIAGNOSIS — E1149 Type 2 diabetes mellitus with other diabetic neurological complication: Secondary | ICD-10-CM | POA: Diagnosis present

## 2015-01-13 LAB — IRON AND TIBC
IRON: 13 ug/dL — AB (ref 28–170)
SATURATION RATIOS: 3 % — AB (ref 10.4–31.8)
TIBC: 435 ug/dL (ref 250–450)
UIBC: 422 ug/dL

## 2015-01-13 LAB — CBC WITH DIFFERENTIAL/PLATELET
BASOS ABS: 0 10*3/uL (ref 0.0–0.1)
Basophils Relative: 0 % (ref 0–1)
EOS PCT: 1 % (ref 0–5)
Eosinophils Absolute: 0.1 10*3/uL (ref 0.0–0.7)
HEMATOCRIT: 20.7 % — AB (ref 36.0–46.0)
HEMOGLOBIN: 6.3 g/dL — AB (ref 12.0–15.0)
LYMPHS PCT: 14 % (ref 12–46)
Lymphs Abs: 1.2 10*3/uL (ref 0.7–4.0)
MCH: 21.5 pg — ABNORMAL LOW (ref 26.0–34.0)
MCHC: 30.4 g/dL (ref 30.0–36.0)
MCV: 70.6 fL — ABNORMAL LOW (ref 78.0–100.0)
MONOS PCT: 10 % (ref 3–12)
Monocytes Absolute: 0.9 10*3/uL (ref 0.1–1.0)
NEUTROS PCT: 75 % (ref 43–77)
Neutro Abs: 6.6 10*3/uL (ref 1.7–7.7)
Platelets: 522 10*3/uL — ABNORMAL HIGH (ref 150–400)
RBC: 2.93 MIL/uL — AB (ref 3.87–5.11)
RDW: 16.1 % — ABNORMAL HIGH (ref 11.5–15.5)
WBC: 8.8 10*3/uL (ref 4.0–10.5)

## 2015-01-13 LAB — ABO/RH: ABO/RH(D): A NEG

## 2015-01-13 LAB — BASIC METABOLIC PANEL
ANION GAP: 6 (ref 5–15)
BUN: 23 mg/dL — ABNORMAL HIGH (ref 6–20)
CHLORIDE: 95 mmol/L — AB (ref 101–111)
CO2: 25 mmol/L (ref 22–32)
CREATININE: 0.69 mg/dL (ref 0.44–1.00)
Calcium: 10.3 mg/dL (ref 8.9–10.3)
GFR calc non Af Amer: >60 mL/min (ref >60)
Glucose, Bld: 168 mg/dL — ABNORMAL HIGH (ref 65–99)
Potassium: 4.7 mmol/L (ref 3.5–5.1)
SODIUM: 126 mmol/L — AB (ref 135–145)

## 2015-01-13 LAB — RETICULOCYTES
RBC.: 2.93 MIL/uL — AB (ref 3.87–5.11)
RETIC COUNT ABSOLUTE: 64.5 10*3/uL (ref 19.0–186.0)
Retic Ct Pct: 2.2 % (ref 0.4–3.1)

## 2015-01-13 LAB — FOLATE: FOLATE: 28 ng/mL (ref >5.9)

## 2015-01-13 LAB — VITAMIN B12: VITAMIN B 12: 435 pg/mL (ref 180–914)

## 2015-01-13 LAB — PREPARE RBC (CROSSMATCH)

## 2015-01-13 LAB — FERRITIN: Ferritin: 9 ng/mL — ABNORMAL LOW (ref 11–307)

## 2015-01-13 MED ORDER — HYDROCHLOROTHIAZIDE 12.5 MG PO CAPS
12.5000 mg | ORAL_CAPSULE | Freq: Every day | ORAL | Status: DC
  Administered 2015-01-14: 12.5 mg via ORAL
  Filled 2015-01-13: qty 1

## 2015-01-13 MED ORDER — BENAZEPRIL-HYDROCHLOROTHIAZIDE 20-12.5 MG PO TABS: 1.0000 | ORAL_TABLET | Freq: Every day | ORAL | Status: DC

## 2015-01-13 MED ORDER — ONDANSETRON HCL 4 MG PO TABS: 4.0000 mg | ORAL_TABLET | Freq: Four times a day (QID) | ORAL | Status: DC | PRN

## 2015-01-13 MED ORDER — VITAMIN B-1 100 MG PO TABS
100.0000 mg | ORAL_TABLET | Freq: Every day | ORAL | Status: DC
  Administered 2015-01-13 – 2015-01-14 (×2): 100 mg via ORAL
  Filled 2015-01-13 (×2): qty 1

## 2015-01-13 MED ORDER — LORAZEPAM 0.5 MG PO TABS
0.5000 mg | ORAL_TABLET | Freq: Once | ORAL | Status: AC
  Administered 2015-01-13: 0.5 mg via ORAL
  Filled 2015-01-13: qty 1

## 2015-01-13 MED ORDER — SODIUM CHLORIDE 0.9 % IV SOLN
Freq: Once | INTRAVENOUS | Status: AC
  Administered 2015-01-13: 22:00:00 via INTRAVENOUS

## 2015-01-13 MED ORDER — BENAZEPRIL HCL 20 MG PO TABS
20.0000 mg | ORAL_TABLET | Freq: Every day | ORAL | Status: DC
  Administered 2015-01-14: 20 mg via ORAL
  Filled 2015-01-13: qty 1

## 2015-01-13 MED ORDER — HYDROCODONE-ACETAMINOPHEN 5-325 MG PO TABS
1.0000 | ORAL_TABLET | Freq: Four times a day (QID) | ORAL | Status: DC | PRN
  Administered 2015-01-13: 1 via ORAL
  Filled 2015-01-13: qty 1

## 2015-01-13 MED ORDER — ADULT MULTIVITAMIN W/MINERALS CH
1.0000 | ORAL_TABLET | Freq: Every day | ORAL | Status: DC
  Administered 2015-01-13 – 2015-01-14 (×2): 1 via ORAL
  Filled 2015-01-13 (×2): qty 1

## 2015-01-13 MED ORDER — SODIUM CHLORIDE 0.9 % IV SOLN: Freq: Once | INTRAVENOUS | Status: DC

## 2015-01-13 MED ORDER — FOLIC ACID 1 MG PO TABS
1.0000 mg | ORAL_TABLET | Freq: Every day | ORAL | Status: DC
  Administered 2015-01-13 – 2015-01-14 (×2): 1 mg via ORAL
  Filled 2015-01-13 (×2): qty 1

## 2015-01-13 MED ORDER — FUROSEMIDE 10 MG/ML IJ SOLN
20.0000 mg | Freq: Once | INTRAMUSCULAR | Status: AC
  Administered 2015-01-13: 20 mg via INTRAVENOUS
  Filled 2015-01-13: qty 2

## 2015-01-13 MED ORDER — ONDANSETRON HCL 4 MG/2ML IJ SOLN: 4.0000 mg | Freq: Four times a day (QID) | INTRAMUSCULAR | Status: DC | PRN

## 2015-01-13 MED ORDER — ATORVASTATIN CALCIUM 20 MG PO TABS
20.0000 mg | ORAL_TABLET | Freq: Every day | ORAL | Status: DC
  Administered 2015-01-14: 20 mg via ORAL
  Filled 2015-01-13: qty 1

## 2015-01-13 MED ORDER — SODIUM CHLORIDE 0.9 % IJ SOLN: 3.0000 mL | Freq: Two times a day (BID) | INTRAMUSCULAR | Status: DC

## 2015-01-13 MED ORDER — METFORMIN HCL ER 500 MG PO TB24
500.0000 mg | ORAL_TABLET | Freq: Every day | ORAL | Status: DC
  Administered 2015-01-14: 500 mg via ORAL
  Filled 2015-01-13: qty 1

## 2015-01-13 MED ORDER — LORAZEPAM 1 MG PO TABS
1.0000 mg | ORAL_TABLET | Freq: Every day | ORAL | Status: DC
  Administered 2015-01-13: 1 mg via ORAL
  Filled 2015-01-13: qty 1

## 2015-01-13 MED ORDER — SODIUM CHLORIDE 0.9 % IV SOLN
INTRAVENOUS | Status: DC
  Administered 2015-01-13: 22:00:00 via INTRAVENOUS

## 2015-01-13 MED ORDER — ENSURE ENLIVE PO LIQD
237.0000 mL | Freq: Two times a day (BID) | ORAL | Status: DC
  Administered 2015-01-14: 237 mL via ORAL

## 2015-01-13 NOTE — H&P (Signed)
[facility] History and Physical  Kathy Beltran JWJ:[id number] DOB: [birth year] DOA: 01/13/2015  Referring physician: Milton Ferguson, MD PCP: Nyoka Cowden, MD   Chief Complaint: Anemia  HPI: Kathy Beltran is a 81 y.o. female with prior history of DM HTN presents from her PCP for increased weakness poor appetite weight loss and heme +stools. Patient over the last 30 days has been unsteady. Took a fall on Monday and broke her wrist. Patient scheduled a regular appointment at her PCP and they noticed she looked pale and also did check a stool for occult blood which was positive. She notes forgetfullness she has been a little more confused. She has had no shrtness of breath. Apparently exercises regularly until the last month. She has had decreased appetite and she has lost weight of at least 10 pounds. Patient has had no chest pain she has no PND. She has no ankle edema noted. In the ED she had regular labs drawn and she was found to have a Hgb of 6.3 and a sodium of 126   Review of Systems:  12 point ROS performed and are negative other than HPI.   Past Medical History  Diagnosis Date  . DIABETES MELLITUS, TYPE II 12/19/2006  . ANXIETY 12/19/2006  . HYPERTENSION 12/19/2006  . OSTEOARTHRITIS 12/19/2006   Past Surgical History  Procedure Laterality Date  . Hip open reduction      hip fx   Social History:  reports that she has quit smoking. She has never used smokeless tobacco. She reports that she does not drink alcohol or use illicit drugs.  No Known Allergies  Family History  Problem Relation Age of Onset  . Cancer Mother     renal ca - status post nephrectomy  . Diabetes Mother   . Cancer Sister     died of pancreatic ca  . COPD Sister   . Stroke Sister   . Cancer Brother     died of pancreatic ca  . COPD Brother      Prior to Admission medications   Medication Sig Start Date End Date Taking? Authorizing Provider  atorvastatin (LIPITOR) 20 MG tablet TAKE ONE  TABLET BY MOUTH ONCE DAILY 10/28/14  Yes Marletta Lor, MD  benazepril-hydrochlorthiazide (LOTENSIN HCT) 20-12.5 MG per tablet TAKE ONE TABLET BY MOUTH ONCE DAILY 10/28/14  Yes Marletta Lor, MD  HYDROcodone-acetaminophen (NORCO/VICODIN) 5-325 MG per tablet Take 1 tablet by mouth once. Patient taking differently: Take 1 tablet by mouth every 6 (six) hours as needed for moderate pain or severe pain.  01/10/15  Yes Benjamin Cartner, PA-C  LORazepam (ATIVAN) 0.5 MG tablet TAKE ONE TABLET BY MOUTH TWICE DAILY AS NEEDED FOR ANXIETY Patient taking differently: Take 0.1 mg by mouth at bedtime. T 09/16/14  Yes Marletta Lor, MD  metFORMIN (GLUCOPHAGE-XR) 500 MG 24 hr tablet TAKE ONE TABLET BY MOUTH ONCE DAILY WITH  BREAKFAST 10/28/14  Yes Marletta Lor, MD   Physical Exam: Filed Vitals:   01/13/15 1724 01/13/15 1950  BP: 165/68 154/57  Pulse: 83 78  Temp: 98.4 F (36.9 C) 98.4 F (36.9 C)  TempSrc: Oral Oral  Resp: 20 18  SpO2: 100% 96%    Wt Readings from Last 3 Encounters:  01/13/15 55.792 kg (123 lb)  01/10/15 54.432 kg (120 lb)  09/16/14 57.153 kg (126 lb)    General:  Appears calm and comfortable Eyes: PERRL, normal lids, irises & conjunctiva ENT: grossly normal hearing, lips & tongue Neck:  no LAD, masses or thyromegaly Cardiovascular: RRR, no m/r/g. No LE edema. Respiratory: CTA bilaterally, no w/r/r Abdomen: soft, ntnd Skin: no rash or induration Musculoskeletal: grossly normal tone BUE/BLE Psychiatric: grossly normal mood and affect Neurologic: grossly non-focal.          Labs on Admission:  Basic Metabolic Panel:  Recent Labs Lab 01/13/15 1748  NA 126*  K 4.7  CL 95*  CO2 25  GLUCOSE 168*  BUN 23*  CREATININE 0.69  CALCIUM 10.3   Liver Function Tests: No results for input(s): AST, ALT, ALKPHOS, BILITOT, PROT, ALBUMIN in the last 168 hours. No results for input(s): LIPASE, AMYLASE in the last 168 hours. No results for input(s): AMMONIA in  the last 168 hours. CBC:  Recent Labs Lab 01/13/15 1748  WBC 8.8  NEUTROABS 6.6  HGB 6.3*  HCT 20.7*  MCV 70.6*  PLT 522*   Cardiac Enzymes: No results for input(s): CKTOTAL, CKMB, CKMBINDEX, TROPONINI in the last 168 hours.  BNP (last 3 results) No results for input(s): BNP in the last 8760 hours.  ProBNP (last 3 results) No results for input(s): PROBNP in the last 8760 hours.  CBG: No results for input(s): GLUCAP in the last 168 hours.  Radiological Exams on Admission: No results found.    Assessment/Plan Principal Problem:   Symptomatic anemia Active Problems:   Diabetes with neurologic complications   Essential hypertension   Heme positive stool   1. Symptomatic Anemia -patient will be admitted for transfusion -will monitor H/H -patient will need a GI consultation she has never had a scope done in the past though her family has tried to get her to have it  2. DM with neurological complications -will monitor FSBS -SSI as needed -A1C will be ordered  3. Essential HTN -will monitor pressures -will continue with lisinopril HCTZ  4. Heme Positive Stools -as above  5. Hyponatremia -patient started on IVF NS -will recheck her labs in am   Code Status: full code (must indicate code status--if unknown or must be presumed, indicate so) DVT Prophylaxis:SCD Family Communication: none (indicate person spoken with, if applicable, with phone number if by telephone) Disposition Plan: Home(indicate anticipated LOS)    [facility] Pager [phone]

## 2015-01-13 NOTE — ED Notes (Signed)
Dr Zammit at bedside. 

## 2015-01-13 NOTE — ED Notes (Signed)
Patient's son states mom fell on Monday breaking her right wrist.  She did not hit her head.  Went for an office visit with doctor and blood was noted in her stool by her doctor today.  She has become weaker over the past 30 days.  She has some nausea.  Son states mom has had some altered mental status this week.  No diarrhea.

## 2015-01-13 NOTE — Patient Instructions (Signed)
Report to the emergency department immediately for further laboratory testing and possible inpatient evaluation  Discontinue benazepril/hydrochlorothiazide

## 2015-01-13 NOTE — ED Notes (Signed)
Pt reports being restless and wanting in walk around in the room.  Pt denies any lightheadedness or dizziness at this time.  Pt advised to stay in bed d/t risk for falls. Pt and family member expressed understanding.

## 2015-01-13 NOTE — ED Notes (Signed)
Nurse drawing labs. 

## 2015-01-13 NOTE — ED Notes (Signed)
2 unsuccessful IV attempts, Charge RN to attempt.

## 2015-01-13 NOTE — ED Provider Notes (Signed)
CSN: [id number]     Arrival date & time 01/13/15  1714 History   First MD Initiated Contact with Patient 01/13/15 1730     Chief Complaint  Patient presents with  . Blood In Stools     (Consider location/radiation/quality/duration/timing/severity/associated sxs/prior Treatment) Patient is a 81 y.o. female presenting with weakness. The history is provided by the patient (pt states she has been weak,  she saw her md today, and had hem pos stool).  Weakness This is a new problem. The current episode started 1 to 2 hours ago. The problem occurs constantly. The problem has not changed since onset.Pertinent negatives include no chest pain, no abdominal pain and no headaches. Nothing aggravates the symptoms. Nothing relieves the symptoms.    Past Medical History  Diagnosis Date  . DIABETES MELLITUS, TYPE II 12/19/2006  . ANXIETY 12/19/2006  . HYPERTENSION 12/19/2006  . OSTEOARTHRITIS 12/19/2006   Past Surgical History  Procedure Laterality Date  . Hip open reduction      hip fx   Family History  Problem Relation Age of Onset  . Cancer Mother     renal ca - status post nephrectomy  . Diabetes Mother   . Cancer Sister     died of pancreatic ca  . COPD Sister   . Stroke Sister   . Cancer Brother     died of pancreatic ca  . COPD Brother    Social History  Substance Use Topics  . Smoking status: Former Research scientist (life sciences)  . Smokeless tobacco: Never Used  . Alcohol Use: No   OB History    No data available     Review of Systems  Constitutional: Negative for appetite change and fatigue.  HENT: Negative for congestion, ear discharge and sinus pressure.   Eyes: Negative for discharge.  Respiratory: Negative for cough.   Cardiovascular: Negative for chest pain.  Gastrointestinal: Negative for abdominal pain and diarrhea.  Genitourinary: Negative for frequency and hematuria.  Musculoskeletal: Negative for back pain.  Skin: Negative for rash.  Neurological: Positive for weakness. Negative for  seizures and headaches.  Psychiatric/Behavioral: Negative for hallucinations.      Allergies  Review of patient's allergies indicates no known allergies.  Home Medications   Prior to Admission medications   Medication Sig Start Date End Date Taking? Authorizing Provider  atorvastatin (LIPITOR) 20 MG tablet TAKE ONE TABLET BY MOUTH ONCE DAILY 10/28/14  Yes Marletta Lor, MD  benazepril-hydrochlorthiazide (LOTENSIN HCT) 20-12.5 MG per tablet TAKE ONE TABLET BY MOUTH ONCE DAILY 10/28/14  Yes Marletta Lor, MD  HYDROcodone-acetaminophen (NORCO/VICODIN) 5-325 MG per tablet Take 1 tablet by mouth once. Patient taking differently: Take 1 tablet by mouth every 6 (six) hours as needed for moderate pain or severe pain.  01/10/15  Yes Benjamin Cartner, PA-C  LORazepam (ATIVAN) 0.5 MG tablet TAKE ONE TABLET BY MOUTH TWICE DAILY AS NEEDED FOR ANXIETY Patient taking differently: Take 0.1 mg by mouth at bedtime. T 09/16/14  Yes Marletta Lor, MD  metFORMIN (GLUCOPHAGE-XR) 500 MG 24 hr tablet TAKE ONE TABLET BY MOUTH ONCE DAILY WITH  BREAKFAST 10/28/14  Yes Marletta Lor, MD   BP 165/68 mmHg  Pulse 83  Temp(Src) 98.4 F (36.9 C) (Oral)  Resp 20  SpO2 100% Physical Exam  Constitutional: She is oriented to person, place, and time. She appears well-developed.  HENT:  Head: Normocephalic.  Conjunctiva pale  Eyes: Conjunctivae and EOM are normal. No scleral icterus.  Neck: Neck supple. No thyromegaly  present.  Cardiovascular: Normal rate and regular rhythm.  Exam reveals no gallop and no friction rub.   No murmur heard. Pulmonary/Chest: No stridor. She has no wheezes. She has no rales. She exhibits no tenderness.  Abdominal: She exhibits no distension. There is no tenderness. There is no rebound.  Musculoskeletal: Normal range of motion. She exhibits no edema.  Lymphadenopathy:    She has no cervical adenopathy.  Neurological: She is oriented to person, place, and time. She  exhibits normal muscle tone. Coordination normal.  Skin: No rash noted. No erythema.  Psychiatric: She has a normal mood and affect. Her behavior is normal.    ED Course  Procedures (including critical care time) Labs Review Labs Reviewed  CBC WITH DIFFERENTIAL/PLATELET - Abnormal; Notable for the following:    RBC 2.93 (*)    Hemoglobin 6.3 (*)    HCT 20.7 (*)    MCV 70.6 (*)    MCH 21.5 (*)    RDW 16.1 (*)    Platelets 522 (*)    All other components within normal limits  BASIC METABOLIC PANEL - Abnormal; Notable for the following:    Sodium 126 (*)    Chloride 95 (*)    Glucose, Bld 168 (*)    BUN 23 (*)    All other components within normal limits  VITAMIN B12  FOLATE  IRON AND TIBC  FERRITIN  RETICULOCYTES  TYPE AND SCREEN  PREPARE RBC (CROSSMATCH)    Imaging Review No results found. I have personally reviewed and evaluated these images and lab results as part of my medical decision-making.   EKG Interpretation None      MDM   Final diagnoses:  None    Anemia,  Admit and transfuse    Milton Ferguson, MD 01/13/15 1954

## 2015-01-13 NOTE — ED Notes (Signed)
Orthostatics delayed due to blood draw and nurse consult.

## 2015-01-13 NOTE — Progress Notes (Signed)
Pre visit review using our clinic review tool, if applicable. No additional management support is needed unless otherwise documented below in the visit note. 

## 2015-01-13 NOTE — Progress Notes (Signed)
Subjective:    Patient ID: Kathy Beltran, female    DOB: Jun 29, 1927, 81 y.o.   MRN: [id number]  HPI  Wt Readings from Last 3 Encounters:  01/13/15 123 lb (55.792 kg)  01/10/15 120 lb (54.432 kg)  09/16/14 126 lb (57.2 kg)   81 year old patient who was seen 3 days ago and treated for a fracture to the right wrist.  She states that she was taking out her trash early Monday morning in the dark and lost her balance and fell. For the past month she is felt unwell with weakness and poor appetite.  There is been some documented weight loss over the past 3 months.  No nausea or vomiting.  Denies any change in her bowel habits except for much less frequency due to her poor oral intake.  Denies any melena. Denies any history of anemia, or peptic ulcer disease.  The son states that she has repeatedly refused colonoscopies over the years and to his knowledge has never had one performed.  Denies any abdominal pain.  Past Medical History  Diagnosis Date  . DIABETES MELLITUS, TYPE II 12/19/2006  . ANXIETY 12/19/2006  . HYPERTENSION 12/19/2006  . OSTEOARTHRITIS 12/19/2006    Social History   Social History  . Marital Status: Married    Spouse Name: N/A  . Number of Children: N/A  . Years of Education: N/A   Occupational History  . Not on file.   Social History Main Topics  . Smoking status: Former Research scientist (life sciences)  . Smokeless tobacco: Never Used  . Alcohol Use: No  . Drug Use: No  . Sexual Activity: Not on file   Other Topics Concern  . Not on file   Social History Narrative    Past Surgical History  Procedure Laterality Date  . Hip open reduction      hip fx    Family History  Problem Relation Age of Onset  . Cancer Mother     renal ca - status post nephrectomy  . Diabetes Mother   . Cancer Sister     died of pancreatic ca  . COPD Sister   . Stroke Sister   . Cancer Brother     died of pancreatic ca  . COPD Brother     No Known Allergies  Current Outpatient Prescriptions  on File Prior to Visit  Medication Sig Dispense Refill  . atorvastatin (LIPITOR) 20 MG tablet TAKE ONE TABLET BY MOUTH ONCE DAILY 90 tablet 1  . benazepril-hydrochlorthiazide (LOTENSIN HCT) 20-12.5 MG per tablet TAKE ONE TABLET BY MOUTH ONCE DAILY 90 tablet 1  . LORazepam (ATIVAN) 0.5 MG tablet TAKE ONE TABLET BY MOUTH TWICE DAILY AS NEEDED FOR ANXIETY 60 tablet 5  . metFORMIN (GLUCOPHAGE-XR) 500 MG 24 hr tablet TAKE ONE TABLET BY MOUTH ONCE DAILY WITH  BREAKFAST 90 tablet 1  . HYDROcodone-acetaminophen (NORCO/VICODIN) 5-325 MG per tablet Take 1 tablet by mouth once. (Patient not taking: Reported on 01/13/2015) 6 tablet 0   No current facility-administered medications on file prior to visit.    BP 110/76 mmHg  Temp(Src) 97.9 F (36.6 C) (Oral)  Ht 5\' 6"  (1.676 m)  Wt 123 lb (55.792 kg)  BMI 19.86 kg/m2      Review of Systems  Constitutional: Positive for activity change, appetite change and unexpected weight change.  HENT: Negative for congestion, dental problem, hearing loss, rhinorrhea, sinus pressure, sore throat and tinnitus.   Eyes: Negative for pain, discharge and visual disturbance.  Respiratory: Negative  for cough and shortness of breath.   Cardiovascular: Negative for chest pain, palpitations and leg swelling.  Gastrointestinal: Negative for nausea, vomiting, abdominal pain, diarrhea, constipation, blood in stool and abdominal distention.  Genitourinary: Negative for dysuria, urgency, frequency, hematuria, flank pain, vaginal bleeding, vaginal discharge, difficulty urinating, vaginal pain and pelvic pain.  Musculoskeletal: Positive for gait problem. Negative for joint swelling and arthralgias.  Skin: Negative for rash.  Neurological: Positive for weakness. Negative for dizziness, syncope, speech difficulty, numbness and headaches.  Hematological: Negative for adenopathy.  Psychiatric/Behavioral: Positive for confusion. Negative for behavioral problems, dysphoric mood and  agitation. The patient is not nervous/anxious.        Objective:   Physical Exam  Constitutional: She is oriented to person, place, and time. She appears well-developed and well-nourished. No distress.  Extremely pale but in no acute distress Blood pressure 110/70  HENT:  Head: Normocephalic.  Right Ear: External ear normal.  Left Ear: External ear normal.  Mouth/Throat: Oropharynx is clear and moist.  Eyes: Conjunctivae and EOM are normal. Pupils are equal, round, and reactive to light.  Neck: Normal range of motion. Neck supple. No thyromegaly present.  Cardiovascular: Normal rate, regular rhythm, normal heart sounds and intact distal pulses.   Pulmonary/Chest: Effort normal and breath sounds normal.  A few crackles left base  Abdominal: Soft. Bowel sounds are normal. She exhibits no mass. There is no tenderness.  Liver edge palpable on inspiration Umbilical hernia  Genitourinary: Guaiac positive stool.  Musculoskeletal: Normal range of motion. She exhibits no edema.  Lymphadenopathy:    She has no cervical adenopathy.  Neurological: She is alert and oriented to person, place, and time.  Skin: Skin is warm and dry. No rash noted.  Psychiatric: She has a normal mood and affect. Her behavior is normal.          Assessment & Plan:   Rule out severe anemia/heme positive stool.  Options discussed.  Due to the lateness of the day, and presence of hematest positive stool, patient and son agreeable for ED evaluation. History of hypertension.  Patient was told to hold blood pressure medication at this time Diabetes Mild cognitive dysfunction Dyslipidemia Progressive anorexia, loss/heme positive stool.  Will need full GI evaluation

## 2015-01-14 DIAGNOSIS — E440 Moderate protein-calorie malnutrition: Secondary | ICD-10-CM | POA: Insufficient documentation

## 2015-01-14 DIAGNOSIS — D649 Anemia, unspecified: Secondary | ICD-10-CM

## 2015-01-14 LAB — PROTIME-INR
INR: 1.16 (ref 0.00–1.49)
Prothrombin Time: 15 seconds (ref 11.6–15.2)

## 2015-01-14 LAB — COMPREHENSIVE METABOLIC PANEL
ALT: 14 U/L (ref 14–54)
AST: 23 U/L (ref 15–41)
Albumin: 3.5 g/dL (ref 3.5–5.0)
Alkaline Phosphatase: 49 U/L (ref 38–126)
Anion gap: 5 (ref 5–15)
BUN: 19 mg/dL (ref 6–20)
CHLORIDE: 100 mmol/L — AB (ref 101–111)
CO2: 25 mmol/L (ref 22–32)
CREATININE: 0.55 mg/dL (ref 0.44–1.00)
Calcium: 9.7 mg/dL (ref 8.9–10.3)
GFR calc non Af Amer: >60 mL/min (ref >60)
Glucose, Bld: 99 mg/dL (ref 65–99)
Potassium: 4.2 mmol/L (ref 3.5–5.1)
SODIUM: 130 mmol/L — AB (ref 135–145)
Total Bilirubin: 1.2 mg/dL (ref 0.3–1.2)
Total Protein: 6.2 g/dL — ABNORMAL LOW (ref 6.5–8.1)

## 2015-01-14 LAB — HEMOGLOBIN A1C
HEMOGLOBIN A1C: 6.7 % — AB (ref 4.8–5.6)
Mean Plasma Glucose: 146 mg/dL

## 2015-01-14 LAB — TSH: TSH: 1.156 u[IU]/mL (ref 0.350–4.500)

## 2015-01-14 LAB — GLUCOSE, CAPILLARY: Glucose-Capillary: 118 mg/dL — ABNORMAL HIGH (ref 65–99)

## 2015-01-14 MED ORDER — BOOST PLUS PO LIQD
237.0000 mL | Freq: Two times a day (BID) | ORAL | Status: DC
  Filled 2015-01-14: qty 237

## 2015-01-14 MED ORDER — LISINOPRIL 20 MG PO TABS: 20.0000 mg | ORAL_TABLET | Freq: Every day | ORAL | Status: DC

## 2015-01-14 MED ORDER — FERROUS SULFATE 325 (65 FE) MG PO TABS: 325.0000 mg | ORAL_TABLET | Freq: Three times a day (TID) | ORAL | Status: DC

## 2015-01-14 MED ORDER — FERROUS SULFATE 325 (65 FE) MG PO TABS
325.0000 mg | ORAL_TABLET | Freq: Three times a day (TID) | ORAL | Status: DC
  Administered 2015-01-14 (×2): 325 mg via ORAL
  Filled 2015-01-14 (×3): qty 1

## 2015-01-14 NOTE — Progress Notes (Signed)
Initial Nutrition Assessment  DOCUMENTATION CODES:   Non-severe (moderate) malnutrition in context of chronic illness  INTERVENTION:  - Will order Boost Plus BID, each supplement provides 360 kcal and 14 grams protein - RD will continue to monitor for needs  NUTRITION DIAGNOSIS:   Inadequate oral intake related to poor appetite as evidenced by per patient/family report.  GOAL:   Patient will meet greater than or equal to 90% of their needs  MONITOR:   PO intake, Supplement acceptance, Weight trends, Labs, I & O's  REASON FOR ASSESSMENT:   Malnutrition Screening Tool  ASSESSMENT:   81 y.o. female with prior history of DM HTN presents from her PCP for increased weakness poor appetite weight loss and heme +stools. Patient over the last 30 days has been unsteady. Took a fall on Monday and broke her wrist. Patient scheduled a regular appointment at her PCP and they noticed she looked pale and also did check a stool for occult blood which was positive. She notes forgetfullness she has been a little more confused. She has had no shrtness of breath. Apparently exercises regularly until the last month. She has had decreased appetite and she has lost weight of at least 10 pounds.   Pt seen for MST. BMI indicates normal weight status. Per chart review, pt ate 10% of breakfast. She states that she had sausage, breakfast potatoes, and oatmeal. She indicates poor appetite usually when she is at home although some days she eats well. When she is not feeling hungry she often drinks Boost.   She denies abdominal pain or nausea. Noted that pt was missing all or nearly all of her teeth. She states that she has dentures but is unsure where they are this AM.   Pt reports 10 lb weight loss in the past month. This would indicate 8% weight loss in this time frame. Noted mild fat and moderate fat wasting.  Not meeting needs. Asked pt if she would like Boost to be ordered during admission and she reports  she is probably going home today; will order supplement in case.   Medications reviewed. Labs reviewed; Na: 130 mmol/L, Cl: 100 mmol/L.   Diet Order:  Diet heart healthy/carb modified Room service appropriate?: Yes; Fluid consistency:: Thin  Skin:  Reviewed, no issues  Last BM:  9/1  Height:   Ht Readings from Last 1 Encounters:  01/13/15 5\' 6"  (1.676 m)    Weight:   Wt Readings from Last 1 Encounters:  01/13/15 121 lb 0.5 oz (54.9 kg)    Ideal Body Weight:  59.09 kg (kg)  BMI:  Body mass index is 19.54 kg/(m^2).  Estimated Nutritional Needs:   Kcal:  7902-4097  Protein:  55-65 grams  Fluid:  2 L/day  EDUCATION NEEDS:   No education needs identified at this time     Jarome Matin, RD, LDN Inpatient Clinical Dietitian Pager # [phone] After hours/weekend pager # [phone]

## 2015-01-14 NOTE — Discharge Summary (Addendum)
Physician Discharge Summary  Kathy Beltran NIO:[id number] DOB: [birth year] DOA: 01/13/2015  PCP: Kathy Cowden, MD  Admit date: 01/13/2015 Discharge date: 01/14/2015  Time spent: 45 minutes  Recommendations for Outpatient Follow-up:  -Will be discharged home today. -Will need to schedule colonoscopy as an OP for further evaluation of heme + stools. -Advised follow up with PCP in 1 week to recheck Hb.   Discharge Diagnoses:  Principal Problem:   Symptomatic anemia Active Problems:   Diabetes with neurologic complications   Essential hypertension   Heme positive stool   Malnutrition of moderate degree   Discharge Condition: Stable and improved  Filed Weights   01/13/15 2048  Weight: 54.9 kg (121 lb 0.5 oz)    History of present illness:  As per Dr. Humphrey Rolls 9/1: Kathy Beltran is a 81 y.o. female with prior history of DM HTN presents from her PCP for increased weakness poor appetite weight loss and heme +stools. Patient over the last 30 days has been unsteady. Took a fall on Monday and broke her wrist. Patient scheduled a regular appointment at her PCP and they noticed she looked pale and also did check a stool for occult blood which was positive. She notes forgetfullness she has been a little more confused. She has had no shrtness of breath. Apparently exercises regularly until the last month. She has had decreased appetite and she has lost weight of at least 10 pounds. Patient has had no chest pain she has no PND. She has no ankle edema noted. In the ED she had regular labs drawn and she was found to have a Hgb of 6.3 and a sodium of 126  Hospital Course:   Chronic Blood Loss Anemia/Heme + Stool -Had heme +stools. -Patient denies frank blood or dark stools. -Very microcytic and a ferritin of 9 that seems to point towards iron deficiency. -Received 2 units of PRBCs with appropriate Hb response from 6.3 on admission to 8.9 on DC. -Will start on ferrous sulfate TID on  DC. -Have discussed importance of colonoscopy in the OP setting for evaluation. She also c/o some weight loss and poor appetite which are concerning for a GI malignancy. She has refused colonoscopy in the past.  Hyponatremia -Improved on DC from 126 to 130. -ACE-I/HCTZ has been discontinued as to avoid HCTZ with hyponatremia. Have given script for lisinopril alone.  Rest of chronic conditions have been stable.  Procedures:  None   Consultations:  None  Discharge Instructions      Discharge Instructions    Increase activity slowly    Complete by:  As directed             Medication List    STOP taking these medications        benazepril-hydrochlorthiazide 20-12.5 MG per tablet  Commonly known as:  LOTENSIN HCT      TAKE these medications        atorvastatin 20 MG tablet  Commonly known as:  LIPITOR  TAKE ONE TABLET BY MOUTH ONCE DAILY     ferrous sulfate 325 (65 FE) MG tablet  Take 1 tablet (325 mg total) by mouth 3 (three) times daily with meals.     HYDROcodone-acetaminophen 5-325 MG per tablet  Commonly known as:  NORCO/VICODIN  Take 1 tablet by mouth once.     lisinopril 20 MG tablet  Commonly known as:  PRINIVIL,ZESTRIL  Take 1 tablet (20 mg total) by mouth daily.     LORazepam 0.5 MG  tablet  Commonly known as:  ATIVAN  TAKE ONE TABLET BY MOUTH TWICE DAILY AS NEEDED FOR ANXIETY     metFORMIN 500 MG 24 hr tablet  Commonly known as:  GLUCOPHAGE-XR  TAKE ONE TABLET BY MOUTH ONCE DAILY WITH  BREAKFAST       Not on File Follow-up Information    Follow up with Kathy Cowden, MD. Schedule an appointment as soon as possible for a visit in 2 weeks.   Specialty:  Internal Medicine   Contact information:   [address] [place] [address] [phone]        The results of significant diagnostics from this hospitalization (including imaging, microbiology, ancillary and laboratory) are listed below for reference.     Significant Diagnostic Studies: Dg Wrist Complete Right  01/10/2015   CLINICAL DATA:  Status post fall this morning with right wrist pain and swelling.  EXAM: RIGHT WRIST - COMPLETE 3+ VIEW  COMPARISON:  None.  FINDINGS: There is comminuted displaced intra-articular fracture of distal radius. There is no dislocation.  IMPRESSION: Comminuted displaced intra-articular fracture of distal radius.   Electronically Signed   By: Abelardo Diesel M.D.   On: 01/10/2015 11:20    Microbiology: No results found for this or any previous visit (from the past 240 hour(s)).   Labs: Basic Metabolic Panel:  Recent Labs Lab 01/13/15 1748 01/14/15 0445  NA 126* 130*  K 4.7 4.2  CL 95* 100*  CO2 25 25  GLUCOSE 168* 99  BUN 23* 19  CREATININE 0.69 0.55  CALCIUM 10.3 9.7   Liver Function Tests:  Recent Labs Lab 01/14/15 0445  AST 23  ALT 14  ALKPHOS 49  BILITOT 1.2  PROT 6.2*  ALBUMIN 3.5   No results for input(s): LIPASE, AMYLASE in the last 168 hours. No results for input(s): AMMONIA in the last 168 hours. CBC:  Recent Labs Lab 01/13/15 1748 01/14/15 0445  WBC 8.8 7.4  NEUTROABS 6.6  --   HGB 6.3* 8.9*  HCT 20.7* 27.5*  MCV 70.6* 75.8*  PLT 522* 431*   Cardiac Enzymes: No results for input(s): CKTOTAL, CKMB, CKMBINDEX, TROPONINI in the last 168 hours. BNP: BNP (last 3 results) No results for input(s): BNP in the last 8760 hours.  ProBNP (last 3 results) No results for input(s): PROBNP in the last 8760 hours.  CBG:  Recent Labs Lab 01/14/15 0725  GLUCAP 118*       Signed:  Lelon Frohlich  [facility] Pager: [phone] 01/14/2015, 1:59 PM

## 2015-01-15 LAB — CBC
HCT: 27.5 % — ABNORMAL LOW (ref 36.0–46.0)
Hemoglobin: 8.9 g/dL — ABNORMAL LOW (ref 12.0–15.0)
MCH: 24.5 pg — ABNORMAL LOW (ref 26.0–34.0)
MCHC: 32.4 g/dL (ref 30.0–36.0)
MCV: 75.8 fL — ABNORMAL LOW (ref 78.0–100.0)
PLATELETS: 431 10*3/uL — AB (ref 150–400)
RBC: 3.63 MIL/uL — AB (ref 3.87–5.11)
RDW: 18.6 % — ABNORMAL HIGH (ref 11.5–15.5)
WBC: 7.4 10*3/uL (ref 4.0–10.5)

## 2015-01-18 LAB — TYPE AND SCREEN
ABO/RH(D): A NEG
Antibody Screen: NEGATIVE
UNIT DIVISION: 0
UNIT DIVISION: 0
UNIT DIVISION: 0
Unit division: 0

## 2015-02-01 ENCOUNTER — Encounter: Payer: Self-pay | Admitting: Internal Medicine

## 2015-02-01 ENCOUNTER — Ambulatory Visit (INDEPENDENT_AMBULATORY_CARE_PROVIDER_SITE_OTHER): Payer: Medicare Other | Admitting: Internal Medicine

## 2015-02-01 VITALS — BP 150/60 | HR 81 | Temp 98.5°F | Resp 18 | Ht 66.0 in | Wt 116.0 lb

## 2015-02-01 DIAGNOSIS — D500 Iron deficiency anemia secondary to blood loss (chronic): Secondary | ICD-10-CM | POA: Diagnosis not present

## 2015-02-01 DIAGNOSIS — Z23 Encounter for immunization: Secondary | ICD-10-CM

## 2015-02-01 LAB — CBC WITH DIFFERENTIAL/PLATELET
BASOS ABS: 0 10*3/uL (ref 0.0–0.1)
BASOS PCT: 0.3 % (ref 0.0–3.0)
EOS ABS: 0 10*3/uL (ref 0.0–0.7)
Eosinophils Relative: 0.4 % (ref 0.0–5.0)
HCT: 29.9 % — ABNORMAL LOW (ref 36.0–46.0)
Hemoglobin: 9.6 g/dL — ABNORMAL LOW (ref 12.0–15.0)
LYMPHS ABS: 0.8 10*3/uL (ref 0.7–4.0)
Lymphocytes Relative: 10.8 % — ABNORMAL LOW (ref 12.0–46.0)
MCHC: 32.1 g/dL (ref 30.0–36.0)
MCV: 77.6 fl — AB (ref 78.0–100.0)
MONOS PCT: 6.3 % (ref 3.0–12.0)
Monocytes Absolute: 0.5 10*3/uL (ref 0.1–1.0)
NEUTROS ABS: 6.3 10*3/uL (ref 1.4–7.7)
NEUTROS PCT: 82.2 % — AB (ref 43.0–77.0)
PLATELETS: 459 10*3/uL — AB (ref 150.0–400.0)
RBC: 3.86 Mil/uL — ABNORMAL LOW (ref 3.87–5.11)
RDW: 25.3 % — AB (ref 11.5–15.5)
WBC: 7.7 10*3/uL (ref 4.0–10.5)

## 2015-02-01 NOTE — Progress Notes (Signed)
Subjective:    Patient ID: Kathy Beltran, female    DOB: 11-02-1927, 81 y.o.   MRN: [id number]  HPI  Wt Readings from Last 3 Encounters:  02/01/15 116 lb (52.617 kg)  01/13/15 121 lb 0.5 oz (54.9 kg)  01/13/15 123 lb (55.792 kg)   Admit date: 01/13/2015 Discharge date: 01/14/2015  Recommendations for Outpatient Follow-up:  -Will be discharged home today. -Will need to schedule colonoscopy as an OP for further evaluation of heme + stools. -Advised follow up with PCP in 1 week to recheck Hb.  Discharge Diagnoses:  Principal Problem:  Symptomatic anemia Active Problems:  Diabetes with neurologic complications  Essential hypertension  Heme positive stool  Malnutrition of moderate degree   Discharge Condition: Stable and improved  Filed Weights   01/13/15 2048  Weight: 54.9 kg (121 lb 0.5 oz)    History of present illness:  As per Dr. Humphrey Rolls 9/1: Kathy Beltran is a 81 y.o. female with prior history of DM HTN presents from her PCP for increased weakness poor appetite weight loss and heme +stools. Patient over the last 30 days has been unsteady. Took a fall on Monday and broke her wrist. Patient scheduled a regular appointment at her PCP and they noticed she looked pale and also did check a stool for occult blood which was positive. She notes forgetfullness she has been a little more confused. She has had no shrtness of breath. Apparently exercises regularly until the last month. She has had decreased appetite and she has lost weight of at least 10 pounds. Patient has had no chest pain she has no PND. She has no ankle edema noted. In the ED she had regular labs drawn and she was found to have a Hgb of 6.3 and a sodium of 126  Hospital Course:   Chronic Blood Loss Anemia/Heme + Stool -Had heme +stools. -Patient denies frank blood or dark stools. -Very microcytic and a ferritin of 9 that seems to point towards iron deficiency. -Received 2 units of PRBCs with  appropriate Hb response from 6.3 on admission to 8.9 on DC. -Will start on ferrous sulfate TID on DC. -Have discussed importance of colonoscopy in the OP setting for evaluation. She also c/o some weight loss and poor appetite which are concerning for a GI malignancy. She has refused colonoscopy in the past     81 year old patient who is seen today following a recent hospital discharge.  She was admitted for evaluation of hematest Positive stool and severe anemia.  She received 2 units of packed RBCs and was discharged.  She was discharged on iron, which she continues to take once daily.  Her stools remain dark. She feels much improved but still with anorexia and ongoing weight loss.  No abdominal pain  Past Medical History  Diagnosis Date  . DIABETES MELLITUS, TYPE II 12/19/2006  . ANXIETY 12/19/2006  . HYPERTENSION 12/19/2006  . OSTEOARTHRITIS 12/19/2006    Social History   Social History  . Marital Status: Married    Spouse Name: N/A  . Number of Children: N/A  . Years of Education: N/A   Occupational History  . Not on file.   Social History Main Topics  . Smoking status: Former Research scientist (life sciences)  . Smokeless tobacco: Never Used  . Alcohol Use: No  . Drug Use: No  . Sexual Activity: Not on file   Other Topics Concern  . Not on file   Social History Narrative    Past Surgical History  Procedure Laterality Date  . Hip open reduction      hip fx    Family History  Problem Relation Age of Onset  . Cancer Mother     renal ca - status post nephrectomy  . Diabetes Mother   . Cancer Sister     died of pancreatic ca  . COPD Sister   . Stroke Sister   . Cancer Brother     died of pancreatic ca  . COPD Brother     No Known Allergies  Current Outpatient Prescriptions on File Prior to Visit  Medication Sig Dispense Refill  . atorvastatin (LIPITOR) 20 MG tablet TAKE ONE TABLET BY MOUTH ONCE DAILY 90 tablet 1  . ferrous sulfate 325 (65 FE) MG tablet Take 1 tablet (325 mg total) by  mouth 3 (three) times daily with meals. 90 tablet 3  . lisinopril (PRINIVIL,ZESTRIL) 20 MG tablet Take 1 tablet (20 mg total) by mouth daily. 30 tablet 3  . LORazepam (ATIVAN) 0.5 MG tablet TAKE ONE TABLET BY MOUTH TWICE DAILY AS NEEDED FOR ANXIETY (Patient taking differently: Take 1 mg by mouth at bedtime. T) 60 tablet 5  . metFORMIN (GLUCOPHAGE-XR) 500 MG 24 hr tablet TAKE ONE TABLET BY MOUTH ONCE DAILY WITH  BREAKFAST 90 tablet 1  . HYDROcodone-acetaminophen (NORCO/VICODIN) 5-325 MG per tablet Take 1 tablet by mouth once. (Patient not taking: Reported on 02/01/2015) 6 tablet 0   No current facility-administered medications on file prior to visit.    BP 150/60 mmHg  Pulse 81  Temp(Src) 98.5 F (36.9 C) (Oral)  Resp 18  Ht 5\' 6"  (1.676 m)  Wt 116 lb (52.617 kg)  BMI 18.73 kg/m2  SpO2 98%      Review of Systems  Constitutional: Positive for activity change, appetite change, fatigue and unexpected weight change.  HENT: Negative for congestion, dental problem, hearing loss, rhinorrhea, sinus pressure, sore throat and tinnitus.   Eyes: Negative for pain, discharge and visual disturbance.  Respiratory: Negative for cough and shortness of breath.   Cardiovascular: Negative for chest pain, palpitations and leg swelling.  Gastrointestinal: Positive for constipation. Negative for nausea, vomiting, abdominal pain, diarrhea, blood in stool and abdominal distention.       Dark stool while on iron therapy  Genitourinary: Negative for dysuria, urgency, frequency, hematuria, flank pain, vaginal bleeding, vaginal discharge, difficulty urinating, vaginal pain and pelvic pain.  Musculoskeletal: Negative for joint swelling, arthralgias and gait problem.  Skin: Negative for rash.  Neurological: Negative for dizziness, syncope, speech difficulty, weakness, numbness and headaches.  Hematological: Negative for adenopathy.  Psychiatric/Behavioral: Negative for behavioral problems, dysphoric mood and  agitation. The patient is not nervous/anxious.        Objective:   Physical Exam  Constitutional: She is oriented to person, place, and time. She appears well-developed and well-nourished. No distress.  Blood pressure 140/60  HENT:  Head: Normocephalic.  Right Ear: External ear normal.  Left Ear: External ear normal.  Mouth/Throat: Oropharynx is clear and moist.  Eyes: Conjunctivae and EOM are normal. Pupils are equal, round, and reactive to light.  Neck: Normal range of motion. Neck supple. No thyromegaly present.  Cardiovascular: Normal rate, regular rhythm, normal heart sounds and intact distal pulses.   No tachycardia  Pulmonary/Chest: Effort normal and breath sounds normal.  Abdominal: Soft. Bowel sounds are normal. She exhibits no mass. There is no tenderness.  Musculoskeletal: Normal range of motion.  Lymphadenopathy:    She has no cervical adenopathy.  Neurological:  She is alert and oriented to person, place, and time.  Skin: Skin is warm and dry. No rash noted.  Psychiatric: She has a normal mood and affect. Her behavior is normal.          Assessment & Plan:   Severe iron deficiency anemia secondary to chronic GI blood loss.  Will set her for GI consultation Check CBC today Hypertension Cognitive impairment Diabetes mellitus  Recheck 2 weeks GI referral

## 2015-02-01 NOTE — Patient Instructions (Signed)
GI follow-up as discussed  Continue iron therapy  Return in 2 weeks for follow-up

## 2015-02-01 NOTE — Progress Notes (Signed)
Pre visit review using our clinic review tool, if applicable. No additional management support is needed unless otherwise documented below in the visit note. 

## 2015-02-16 ENCOUNTER — Ambulatory Visit (INDEPENDENT_AMBULATORY_CARE_PROVIDER_SITE_OTHER): Payer: Medicare Other | Admitting: Physician Assistant

## 2015-02-16 ENCOUNTER — Encounter: Payer: Self-pay | Admitting: Physician Assistant

## 2015-02-16 ENCOUNTER — Other Ambulatory Visit (INDEPENDENT_AMBULATORY_CARE_PROVIDER_SITE_OTHER): Payer: Medicare Other

## 2015-02-16 VITALS — BP 188/72 | HR 68 | Ht 66.0 in | Wt 119.0 lb

## 2015-02-16 DIAGNOSIS — R630 Anorexia: Secondary | ICD-10-CM | POA: Diagnosis not present

## 2015-02-16 DIAGNOSIS — D509 Iron deficiency anemia, unspecified: Secondary | ICD-10-CM

## 2015-02-16 DIAGNOSIS — R195 Other fecal abnormalities: Secondary | ICD-10-CM

## 2015-02-16 DIAGNOSIS — R194 Change in bowel habit: Secondary | ICD-10-CM

## 2015-02-16 DIAGNOSIS — R634 Abnormal weight loss: Secondary | ICD-10-CM

## 2015-02-16 LAB — CBC WITH DIFFERENTIAL/PLATELET
BASOS ABS: 0 10*3/uL (ref 0.0–0.1)
Basophils Relative: 0.4 % (ref 0.0–3.0)
EOS ABS: 0.1 10*3/uL (ref 0.0–0.7)
Eosinophils Relative: 1.4 % (ref 0.0–5.0)
HEMATOCRIT: 31.2 % — AB (ref 36.0–46.0)
Hemoglobin: 10.3 g/dL — ABNORMAL LOW (ref 12.0–15.0)
LYMPHS PCT: 14.2 % (ref 12.0–46.0)
Lymphs Abs: 1.2 10*3/uL (ref 0.7–4.0)
MCHC: 32.9 g/dL (ref 30.0–36.0)
MCV: 78.9 fl (ref 78.0–100.0)
Monocytes Absolute: 0.7 10*3/uL (ref 0.1–1.0)
Monocytes Relative: 8.4 % (ref 3.0–12.0)
NEUTROS ABS: 6.2 10*3/uL (ref 1.4–7.7)
Neutrophils Relative %: 75.6 % (ref 43.0–77.0)
PLATELETS: 358 10*3/uL (ref 150.0–400.0)
RBC: 3.95 Mil/uL (ref 3.87–5.11)
RDW: 27.8 % — ABNORMAL HIGH (ref 11.5–15.5)
WBC: 8.2 10*3/uL (ref 4.0–10.5)

## 2015-02-16 LAB — COMPREHENSIVE METABOLIC PANEL
ALT: 14 U/L (ref 0–35)
AST: 17 U/L (ref 0–37)
Albumin: 3.6 g/dL (ref 3.5–5.2)
Alkaline Phosphatase: 60 U/L (ref 39–117)
BILIRUBIN TOTAL: 0.5 mg/dL (ref 0.2–1.2)
BUN: 14 mg/dL (ref 6–23)
CALCIUM: 10.4 mg/dL (ref 8.4–10.5)
CO2: 28 meq/L (ref 19–32)
CREATININE: 0.63 mg/dL (ref 0.40–1.20)
Chloride: 95 mEq/L — ABNORMAL LOW (ref 96–112)
GFR: 95.41 mL/min (ref >60.00)
GLUCOSE: 117 mg/dL — AB (ref 70–99)
Potassium: 4.4 mEq/L (ref 3.5–5.1)
Sodium: 128 mEq/L — ABNORMAL LOW (ref 135–145)
TOTAL PROTEIN: 6.4 g/dL (ref 6.0–8.3)

## 2015-02-16 MED ORDER — OMEPRAZOLE 20 MG PO CPDR: 20.0000 mg | DELAYED_RELEASE_CAPSULE | Freq: Every day | ORAL | Status: DC

## 2015-02-16 NOTE — Patient Instructions (Addendum)
You have been scheduled for an endoscopy and colonoscopy. Please follow the written instructions given to you at your visit today. Please pick up your prep supplies at the pharmacy within the next 1-3 days. If you use inhalers (even only as needed), please bring them with you on the day of your procedure.  We have sent the following medications to your pharmacy for you to pick up at your convenience: Omeprazole  You have been scheduled for a CT scan of the abdomen and pelvis at [facility] are scheduled on 02-21-15 at 2:30pm. You should arrive 15 minutes prior to your appointment time for registration. Please follow the written instructions below on the day of your exam:  WARNING: IF YOU ARE ALLERGIC TO IODINE/X-RAY DYE, PLEASE NOTIFY RADIOLOGY IMMEDIATELY AT [phone]! YOU WILL BE GIVEN A 13 HOUR PREMEDICATION PREP.  1) Do not eat or drink anything after  (4 hours prior to your test) 2) You have been given 2 bottles of oral contrast to drink. The solution may taste better if refrigerated, but do NOT add ice or any other liquid to this solution. Shake             well before drinking.    Drink 1 bottle of contrast @ 12:30pm (2 hours prior to your exam)  Drink 1 bottle of contrast @ 1:30pm (1 hour prior to your exam)  You may take any medications as prescribed with a small amount of water except for the following: Metformin, Glucophage, Glucovance, Avandamet, Riomet, Fortamet, Actoplus Met, Janumet, Glumetza or Metaglip. The above medications must be held the day of the exam AND 48 hours after the exam.  The purpose of you drinking the oral contrast is to aid in the visualization of your intestinal tract. The contrast solution may cause some diarrhea. Before your exam is started, you will be given a small amount of fluid to drink. Depending on your individual set of symptoms, you may also receive an intravenous injection of x-ray contrast/dye. Plan on being at [facility]  for 30 minutes or long, depending on the type of exam you are having performed.  This test typically takes 30-45 minutes to complete.  If you have any questions regarding your exam or if you need to reschedule, you may call the CT department at [phone] between the hours of 8:00 am and 5:00 pm, Monday-Friday.  ________________________________________________________________________  Your physician has requested that you go to the basement for the following lab work before leaving today: CBC and CMP

## 2015-02-16 NOTE — Progress Notes (Signed)
Patient ID: Kathy Beltran, female   DOB: [birth year], 81 y.o.   MRN: [id number]    HPI:  Kathy Beltran is a 81 y.o.   female  referred by Marletta Lor, MD for evaluation of iron deficiency anemia.  Kathy Beltran is a delightful lady who is not been seen regularly by a doctor in years. She has a history of diabetes and hypertension and presented her her primary care physician several weeks ago with weakness, loss of appetite, weight loss,. She was feeling unsteady on her feet and fell and broke her wrist. When she saw her PCP they noticed she looked pale. Stool for occult blood was positive. She reports that she typically moves her bowels fairly regularly but over the past year has been constipated and has to use milk of magnesia once or twice per week. She has no abdominal pain. Her appetite has been diminished and she states she has lost 15-20 pounds in the past 4-6 months unintentionally. She denies epigastric pain, nausea, or vomiting. When seen by her primary care provider she was found to have a hemoglobin of 6 and was admitted and was transfused 2 units of packed cells and advised to follow-up with gastroenterology as an outpatient for colonoscopy. She has never had a colonoscopy or endoscopy. She denies a family history of colon cancer, colon polyps, or inflammatory bowel disease. She denies a prior history of gastritis or ulcers. She reports that she has been having sweats at night for the past 2 months. She denies a cough.   Past Medical History  Diagnosis Date  . DIABETES MELLITUS, TYPE II 12/19/2006  . ANXIETY 12/19/2006  . HYPERTENSION 12/19/2006  . OSTEOARTHRITIS 12/19/2006    Past Surgical History  Procedure Laterality Date  . Hip open reduction      hip fx   Family History  Problem Relation Age of Onset  . Cancer Mother     renal ca - status post nephrectomy  . Diabetes Mother   . Cancer Sister     died of pancreatic ca  . COPD Sister   . Stroke Sister   . Cancer Brother       died of pancreatic ca  . COPD Brother    Social History  Substance Use Topics  . Smoking status: Former Research scientist (life sciences)  . Smokeless tobacco: Never Used  . Alcohol Use: No   Current Outpatient Prescriptions  Medication Sig Dispense Refill  . atorvastatin (LIPITOR) 20 MG tablet TAKE ONE TABLET BY MOUTH ONCE DAILY 90 tablet 1  . ferrous sulfate 325 (65 FE) MG tablet Take 1 tablet (325 mg total) by mouth 3 (three) times daily with meals. 90 tablet 3  . lisinopril (PRINIVIL,ZESTRIL) 20 MG tablet Take 1 tablet (20 mg total) by mouth daily. 30 tablet 3  . LORazepam (ATIVAN) 0.5 MG tablet TAKE ONE TABLET BY MOUTH TWICE DAILY AS NEEDED FOR ANXIETY (Patient taking differently: Take 1 mg by mouth at bedtime. T) 60 tablet 5  . metFORMIN (GLUCOPHAGE-XR) 500 MG 24 hr tablet TAKE ONE TABLET BY MOUTH ONCE DAILY WITH  BREAKFAST 90 tablet 1  . omeprazole (PRILOSEC) 20 MG capsule Take 1 capsule (20 mg total) by mouth daily. 30 mins prior  To breakfast 30 capsule 3   No current facility-administered medications for this visit.   No Known Allergies   Review of Systems: Gen: Denies any fever, chills.  Has felt weak and tired. Has a diminished appetite and has been losing weight. CV: Denies  chest pain, angina, palpitations, syncope, orthopnea, PND, peripheral edema, and claudication. Resp: Denies dyspnea at rest, dyspnea with exercise, cough, sputum, wheezing, coughing up blood, and pleurisy. GI: Denies vomiting blood, jaundice, and fecal incontinence.   Denies dysphagia or odynophagia. GU : Denies urinary burning, blood in urine, urinary frequency, urinary hesitancy, nocturnal urination, and urinary incontinence. MS: Denies joint pain, limitation of movement, and swelling, stiffness, low back pain, extremity pain. Denies muscle weakness, cramps, atrophy.  Derm: Denies rash, itching, dry skin, hives, moles, warts, or unhealing ulcers.  Psych: Denies depression, anxiety, memory loss, suicidal ideation,  hallucinations, paranoia, and confusion. Heme: Denies bruising, bleeding, and enlarged lymph nodes. Neuro:  Denies any headaches, dizziness, paresthesias.   LAB RESULTS: CBC 05/05/2013 white count 7.1, hemoglobin 11, hematocrit 33.6, MCV 81.1, platelets 328,000. CBC 01/13/2015 white count 8.8, hemoglobin 6.3, hematocrit 20.7, MCV 70.6, platelets 522,000. CBC 02/01/2015 white count 7.7, hemoglobin 9.6, hematocrit 29.9, platelets 459,000. MCV 77.6  Physical Exam: BP 188/72 mmHg  Pulse 68  Ht 5\' 6"  (1.676 m)  Wt 119 lb (53.978 kg)  BMI 19.22 kg/m2 Constitutional: Pleasant,well-developed, elderly Caucasian female in no acute distress. HEENT: Normocephalic and atraumatic. Conjunctivae are normal. No scleral icterus. Neck supple. No JVD Cardiovascular: Normal rate, regular rhythm.  Pulmonary/chest: Effort normal and breath sounds normal. No wheezing, rales or rhonchi. Abdominal: Soft, nondistended, nontender. Bowel sounds active throughout. There are no masses palpable. No hepatomegaly. Extremities: no edema Lymphadenopathy: No cervical adenopathy noted. Neurological: Alert and oriented to person place and time. Skin: Skin is warm and dry. No rashes noted. Psychiatric: Normal mood and affect. Behavior is normal.  ASSESSMENT AND PLAN: 81 year old female recently found to have an iron deficiency anemia and heme positive stools amongst a several month history of anorexia, weight loss, and change in bowel habits, referred for evaluation. She will be scheduled for a colonoscopy to evaluate for polyps, neoplasia, AVMs, as well as an EGD to evaluate for esophagitis, gastritis, ulcer, etc.The risks, benefits, and alternatives to colonoscopy with possible biopsy and possible polypectomy were discussed with the patient and they consent to proceed.  The risks, benefits, and alternatives to endoscopy with possible biopsy and possible dilation were discussed with the patient and they consent to proceed.  The procedures will be scheduled with Dr. Carlean Purl. She will also be scheduled for CT of the abdomen and pelvis to evaluate for other possible intra-abdominal pathology that may be causing her weight loss, night sweats, anorexia, and anemia. She will also be given a trial of omeprazole 20 mg 1 by mouth every morning. A repeat CBC and comprehensive metabolic panel will be obtained today. Further recommendations will be made pending the findings of the above.    Donni Oglesby, Deloris Ping 02/16/2015, 11:53 AM  CC: Marletta Lor, MD

## 2015-02-20 NOTE — Progress Notes (Signed)
Agree w/ Ms. Hvozdovic's note and mangement.  CT is 10/10 - that may lead [place] to EGD or colonoscopy only and alteration of the procedures scheduled for 11/10  Gatha Mayer, MD, [facility]

## 2015-02-21 ENCOUNTER — Ambulatory Visit (HOSPITAL_COMMUNITY)
Admission: RE | Admit: 2015-02-21 | Discharge: 2015-02-21 | Disposition: A | Discharge status: Home / Self Care | Payer: Medicare Other | Source: Ambulatory Visit | Attending: Physician Assistant | Admitting: Physician Assistant

## 2015-02-21 DIAGNOSIS — K5730 Diverticulosis of large intestine without perforation or abscess without bleeding: Secondary | ICD-10-CM | POA: Diagnosis not present

## 2015-02-21 DIAGNOSIS — R195 Other fecal abnormalities: Secondary | ICD-10-CM | POA: Diagnosis not present

## 2015-02-21 DIAGNOSIS — R1909 Other intra-abdominal and pelvic swelling, mass and lump: Secondary | ICD-10-CM | POA: Insufficient documentation

## 2015-02-21 DIAGNOSIS — K8020 Calculus of gallbladder without cholecystitis without obstruction: Secondary | ICD-10-CM | POA: Insufficient documentation

## 2015-02-21 DIAGNOSIS — K429 Umbilical hernia without obstruction or gangrene: Secondary | ICD-10-CM | POA: Insufficient documentation

## 2015-02-21 DIAGNOSIS — R630 Anorexia: Secondary | ICD-10-CM | POA: Diagnosis present

## 2015-02-21 DIAGNOSIS — R634 Abnormal weight loss: Secondary | ICD-10-CM | POA: Insufficient documentation

## 2015-02-21 DIAGNOSIS — D509 Iron deficiency anemia, unspecified: Secondary | ICD-10-CM | POA: Diagnosis present

## 2015-02-21 MED ORDER — IOHEXOL 300 MG/ML  SOLN
100.0000 mL | Freq: Once | INTRAMUSCULAR | Status: AC | PRN
  Administered 2015-02-21: 100 mL via INTRAVENOUS

## 2015-02-21 NOTE — Progress Notes (Signed)
Quick Note:  I could do Wed 10/12 at 0730 if she can  Otherwise would be at hospital week of 10/24 vs later with using 0730 spots after that week  Am ccing Sheri ______

## 2015-02-22 ENCOUNTER — Other Ambulatory Visit: Payer: Self-pay

## 2015-02-22 DIAGNOSIS — D509 Iron deficiency anemia, unspecified: Secondary | ICD-10-CM

## 2015-02-22 DIAGNOSIS — K6389 Other specified diseases of intestine: Secondary | ICD-10-CM

## 2015-02-25 ENCOUNTER — Encounter (HOSPITAL_COMMUNITY): Payer: Self-pay | Admitting: *Deleted

## 2015-03-07 ENCOUNTER — Encounter (HOSPITAL_COMMUNITY)
Admission: RE | Disposition: A | Discharge status: Home / Self Care | Payer: Self-pay | Source: Ambulatory Visit | Attending: Internal Medicine

## 2015-03-07 ENCOUNTER — Ambulatory Visit (HOSPITAL_COMMUNITY)
Admission: RE | Admit: 2015-03-07 | Discharge: 2015-03-07 | Disposition: A | Discharge status: Home / Self Care | Payer: Medicare Other | Source: Ambulatory Visit | Attending: Internal Medicine | Admitting: Internal Medicine

## 2015-03-07 ENCOUNTER — Ambulatory Visit (HOSPITAL_COMMUNITY): Payer: Medicare Other | Admitting: Anesthesiology

## 2015-03-07 ENCOUNTER — Encounter (HOSPITAL_COMMUNITY): Payer: Self-pay

## 2015-03-07 DIAGNOSIS — D124 Benign neoplasm of descending colon: Secondary | ICD-10-CM | POA: Diagnosis present

## 2015-03-07 DIAGNOSIS — I10 Essential (primary) hypertension: Secondary | ICD-10-CM | POA: Insufficient documentation

## 2015-03-07 DIAGNOSIS — R195 Other fecal abnormalities: Secondary | ICD-10-CM | POA: Diagnosis not present

## 2015-03-07 DIAGNOSIS — D123 Benign neoplasm of transverse colon: Secondary | ICD-10-CM | POA: Diagnosis not present

## 2015-03-07 DIAGNOSIS — K6389 Other specified diseases of intestine: Secondary | ICD-10-CM | POA: Diagnosis not present

## 2015-03-07 DIAGNOSIS — Z79899 Other long term (current) drug therapy: Secondary | ICD-10-CM | POA: Insufficient documentation

## 2015-03-07 DIAGNOSIS — D122 Benign neoplasm of ascending colon: Secondary | ICD-10-CM

## 2015-03-07 DIAGNOSIS — Z7984 Long term (current) use of oral hypoglycemic drugs: Secondary | ICD-10-CM | POA: Diagnosis not present

## 2015-03-07 DIAGNOSIS — Z87891 Personal history of nicotine dependence: Secondary | ICD-10-CM | POA: Diagnosis not present

## 2015-03-07 DIAGNOSIS — F419 Anxiety disorder, unspecified: Secondary | ICD-10-CM | POA: Diagnosis not present

## 2015-03-07 DIAGNOSIS — M1990 Unspecified osteoarthritis, unspecified site: Secondary | ICD-10-CM | POA: Diagnosis not present

## 2015-03-07 DIAGNOSIS — R634 Abnormal weight loss: Secondary | ICD-10-CM | POA: Insufficient documentation

## 2015-03-07 DIAGNOSIS — E119 Type 2 diabetes mellitus without complications: Secondary | ICD-10-CM | POA: Diagnosis not present

## 2015-03-07 DIAGNOSIS — D126 Benign neoplasm of colon, unspecified: Secondary | ICD-10-CM | POA: Diagnosis not present

## 2015-03-07 DIAGNOSIS — K5900 Constipation, unspecified: Secondary | ICD-10-CM | POA: Insufficient documentation

## 2015-03-07 DIAGNOSIS — K5730 Diverticulosis of large intestine without perforation or abscess without bleeding: Secondary | ICD-10-CM | POA: Diagnosis not present

## 2015-03-07 DIAGNOSIS — D509 Iron deficiency anemia, unspecified: Secondary | ICD-10-CM | POA: Diagnosis not present

## 2015-03-07 DIAGNOSIS — C188 Malignant neoplasm of overlapping sites of colon: Secondary | ICD-10-CM | POA: Insufficient documentation

## 2015-03-07 HISTORY — DX: Reserved for inherently not codable concepts without codable children: IMO0001

## 2015-03-07 HISTORY — PX: COLONOSCOPY WITH PROPOFOL: SHX5780

## 2015-03-07 LAB — GLUCOSE, CAPILLARY: Glucose-Capillary: 142 mg/dL — ABNORMAL HIGH (ref 65–99)

## 2015-03-07 SURGERY — COLONOSCOPY WITH PROPOFOL
Anesthesia: Monitor Anesthesia Care

## 2015-03-07 MED ORDER — LACTATED RINGERS IV SOLN
INTRAVENOUS | Status: DC
  Administered 2015-03-07: 1000 mL via INTRAVENOUS

## 2015-03-07 MED ORDER — PROPOFOL 10 MG/ML IV BOLUS
INTRAVENOUS | Status: DC | PRN
  Administered 2015-03-07: 20 mg via INTRAVENOUS
  Administered 2015-03-07 (×10): 10 mg via INTRAVENOUS
  Administered 2015-03-07 (×3): 20 mg via INTRAVENOUS
  Administered 2015-03-07 (×3): 10 mg via INTRAVENOUS
  Administered 2015-03-07: 20 mg via INTRAVENOUS
  Administered 2015-03-07: 10 mg via INTRAVENOUS
  Administered 2015-03-07 (×5): 20 mg via INTRAVENOUS
  Administered 2015-03-07 (×2): 10 mg via INTRAVENOUS
  Administered 2015-03-07: 30 mg via INTRAVENOUS
  Administered 2015-03-07 (×3): 10 mg via INTRAVENOUS

## 2015-03-07 MED ORDER — SODIUM CHLORIDE 0.9 % IV SOLN: INTRAVENOUS | Status: DC

## 2015-03-07 MED ORDER — SPOT INK MARKER SYRINGE KIT
PACK | SUBMUCOSAL | Status: DC | PRN
  Administered 2015-03-07: 4 mL via SUBMUCOSAL

## 2015-03-07 MED ORDER — PROPOFOL 10 MG/ML IV BOLUS
INTRAVENOUS | Status: AC
  Filled 2015-03-07: qty 20

## 2015-03-07 MED ORDER — SPOT INK MARKER SYRINGE KIT
PACK | SUBMUCOSAL | Status: AC
  Filled 2015-03-07: qty 5

## 2015-03-07 SURGICAL SUPPLY — 24 items

## 2015-03-07 NOTE — Interval H&P Note (Signed)
History and Physical Interval Note:  03/07/2015 10:27 AM  Kathy Beltran  has presented today for surgery, with the diagnosis of iron def anemia colon mass on CT  The various methods of treatment have been discussed with the patient and family. After consideration of risks, benefits and other options for treatment, the patient has consented to  Procedure(s): ESOPHAGOGASTRODUODENOSCOPY (EGD) WITH PROPOFOL (N/A) COLONOSCOPY WITH PROPOFOL (N/A) as a surgical intervention .  The patient's history has been reviewed, patient examined, no change in status, stable for surgery.  I have reviewed the patient's chart and labs.  Questions were answered to the patient's satisfaction.     CT abd/pelvis has shown right colon mass so will start with colonoscopy and EGD may not be needed Silvano Rusk

## 2015-03-07 NOTE — Discharge Instructions (Addendum)
° °  I found a tumor in the colon that I think is a cancer - biopsies were taken. You have been leaking blood from this. I also found 3 polyps and removed them, and diverticulosis.  You will need surgery for the colon tumor. Once I get the results tomorrow we will arrange for an appointment to see a surgeon and probably a cancer specialist.  Do not take any aspirin, ibuprofen, aleve, etc. For two weeks ( Until November 8). Acetaminophen (Tylenol) is ok.  YOU HAD AN ENDOSCOPIC PROCEDURE TODAY: Refer to the procedure report and other information in the discharge instructions given to you for any specific questions about what was found during the examination. If this information does not answer your questions, please call Dr. Celesta Aver office at [phone] to clarify.   YOU SHOULD EXPECT: Some feelings of bloating in the abdomen. Passage of more gas than usual. Walking can help get rid of the air that was put into your GI tract during the procedure and reduce the bloating. If you had a lower endoscopy (such as a colonoscopy or flexible sigmoidoscopy) you may notice spotting of blood in your stool or on the toilet paper. Some abdominal soreness may be present for a day or two, also.  DIET: Your first meal following the procedure should be a light meal and then it is ok to progress to your normal diet. A half-sandwich or bowl of soup is an example of a good first meal. Heavy or fried foods are harder to digest and may make you feel nauseous or bloated. Drink plenty of fluids but you should avoid alcoholic beverages for 24 hours.   ACTIVITY: Your care partner should take you home directly after the procedure. You should plan to take it easy, moving slowly for the rest of the day. You can resume normal activity the day after the procedure however YOU SHOULD NOT DRIVE, use power tools, machinery or perform tasks that involve climbing or major physical exertion for 24 hours (because of the sedation  medicines used during the test).   SYMPTOMS TO REPORT IMMEDIATELY: A gastroenterologist can be reached at any hour. Please call [phone]  for any of the following symptoms:  Following lower endoscopy (colonoscopy, flexible sigmoidoscopy) Excessive amounts of blood in the stool  Significant tenderness, worsening of abdominal pains  Swelling of the abdomen that is new, acute  Fever of 100 or higher    FOLLOW UP:  If any biopsies were taken you will be contacted by phone or by letter within the next 1-3 weeks. Call [phone]  if you have not heard about the biopsies in 3 weeks.  Please also call with any specific questions about appointments or follow up tests.  I appreciate the opportunity to care for you. Gatha Mayer, MD, [facility]

## 2015-03-07 NOTE — Op Note (Signed)
[facility] [place], [address]   COLONOSCOPY PROCEDURE REPORT  PATIENT: Kathy Beltran, Kathy Beltran  MR#: [id number] BIRTHDATE: November 23, 1927 , 7  yrs. old GENDER: female ENDOSCOPIST: Gatha Mayer, MD, [facility] PROCEDURE DATE:  03/07/2015 PROCEDURE:   Colonoscopy, diagnostic, Colonoscopy with biopsy, Colonoscopy with snare polypectomy, and Submucosal injection, any substance First Screening Colonoscopy - Avg.  risk and is 50 yrs.  old or older - No.  Prior Negative Screening - Now for repeat screening. N/A  History of Adenoma - Now for follow-up colonoscopy & has been > or = to 3 yrs.  N/A  Polyps removed today? Yes ASA CLASS:   Class II INDICATIONS:Unexplained iron deficiency anemia and Patient is not applicable for Colorectal Neoplasm Risk Assessment for this procedure. MEDICATIONS: Monitored anesthesia care and Per Anesthesia  DESCRIPTION OF PROCEDURE:   After the risks benefits and alternatives of the procedure were thoroughly explained, informed consent was obtained.  The digital rectal exam revealed no rectal mass and revealed decreased sphincter tone.   The Pentax Ped Colon [id number]  endoscope was introduced through the anus and advanced to the cecum, which was identified by both the appendix and ileocecal valve. No adverse events experienced.   The quality of the prep was good.  (MiraLax was used)  The instrument was then slowly withdrawn as the colon was fully examined. Estimated blood loss is zero unless otherwise noted in this procedure report.      COLON FINDINGS: A half circumferential large polypoid shaped and ulcerated mass was found in the proximal ascending colon and at the distal cecum.  Multiple biopsies were performed using cold forceps. SPOT tatoo x 2 distal to borders of tumor. Three polypoid shaped polyps ranging from 5 to 56mm in size were found in the descending colon, transverse colon, and ascending colon.  Polypectomies  were performed using snare cautery and with a cold snare.  The resection was complete, the polyp tissue was completely retrieved and sent to histology.   There was diverticulosis noted throughout the entire examined colon.   The examination was otherwise normal. Retroflexed views revealed no abnormalities. The time to cecum = 10.1 Withdrawal time = 23.3   The scope was withdrawn and the procedure completed. COMPLICATIONS: There were no immediate complications.  ENDOSCOPIC IMPRESSION: 1.   Half circumferential large mass was found in the ascending colon and at the cecum; multiple biopsies were performed using cold forceps 2.   Three polyps ranging from 5 to 58mm in size were found in the descending colon, transverse colon, and ascending colon; polypectomies were performed using snare cautery and with a cold snare 3.   Diverticulosis was noted throughout the entire examined colon 4.   The examination was otherwise normal - good prep   RECOMMENDATIONS: 1.  Hold Aspirin and all other NSAIDS for 2 weeks. 2.  Office will call with the results. 3.  Anticipate surgery and oncology appointments after I see pathology results - will also need CT chest, CEA and lab recheck - willl arrange after I see pathology  eSigned:  Gatha Mayer, MD, [facility] 03/07/2015 11:43 AM   cc: Dr. Bluford Kaufmann and The Patient   PATIENT NAME:  Kathy Beltran, Kathy Beltran MR#: [id number]

## 2015-03-07 NOTE — Anesthesia Postprocedure Evaluation (Signed)
  Anesthesia Post-op Note  Patient: Kathy Beltran  Procedure(s) Performed: Procedure(s) (LRB): COLONOSCOPY WITH PROPOFOL (N/A)  Patient Location: PACU  Anesthesia Type: MAC  Level of Consciousness: awake and alert   Airway and Oxygen Therapy: Patient Spontanous Breathing  Post-op Pain: mild  Post-op Assessment: Post-op Vital signs reviewed, Patient's Cardiovascular Status Stable, Respiratory Function Stable, Patent Airway and No signs of Nausea or vomiting  Last Vitals:  Filed Vitals:   03/07/15 1240  BP: 186/41  Pulse: 56  Temp:   Resp: 14    Post-op Vital Signs: stable   Complications: No apparent anesthesia complications

## 2015-03-07 NOTE — Anesthesia Preprocedure Evaluation (Addendum)
Anesthesia Evaluation  Patient identified by MRN, date of birth, ID band Patient awake    Reviewed: Allergy & Precautions, NPO status , Patient's Chart, lab work & pertinent test results  Airway Mallampati: II  TM Distance: >3 FB Neck ROM: Full    Dental no notable dental hx.    Pulmonary shortness of breath, former smoker,    Pulmonary exam normal breath sounds clear to auscultation       Cardiovascular Exercise Tolerance: Good hypertension, Pt. on medications Normal cardiovascular exam Rhythm:Regular Rate:Normal     Neuro/Psych Anxiety Cognitive deficits.    GI/Hepatic negative GI ROS, Neg liver ROS,   Endo/Other  diabetes, Type 2, Oral Hypoglycemic Agents  Renal/GU negative Renal ROS  negative genitourinary   Musculoskeletal  (+) Arthritis ,   Abdominal   Peds negative pediatric ROS (+)  Hematology  (+) anemia ,   Anesthesia Other Findings   Reproductive/Obstetrics negative OB ROS                            Anesthesia Physical Anesthesia Plan  ASA: III  Anesthesia Plan: MAC   Post-op Pain Management:    Induction: Intravenous  Airway Management Planned: Natural Airway  Additional Equipment:   Intra-op Plan:   Post-operative Plan:   Informed Consent: I have reviewed the patients History and Physical, chart, labs and discussed the procedure including the risks, benefits and alternatives for the proposed anesthesia with the patient or authorized representative who has indicated his/her understanding and acceptance.   Dental advisory given  Plan Discussed with: CRNA  Anesthesia Plan Comments:         Anesthesia Quick Evaluation

## 2015-03-07 NOTE — Transfer of Care (Signed)
Immediate Anesthesia Transfer of Care Note  Patient: Kathy Beltran  Procedure(s) Performed: Procedure(s): COLONOSCOPY WITH PROPOFOL (N/A)  Patient Location: PACU and Endoscopy Unit  Anesthesia Type:MAC  Level of Consciousness: awake  Airway & Oxygen Therapy: Patient Spontanous Breathing and Patient connected to nasal cannula oxygen  Post-op Assessment: Report given to RN and Post -op Vital signs reviewed and stable  Post vital signs: Reviewed and stable  Last Vitals:  Filed Vitals:   03/07/15 0947  BP: 211/59  Pulse: 57  Temp: 36.9 C  Resp: 19    Complications: No apparent anesthesia complications

## 2015-03-07 NOTE — H&P (View-Only) (Signed)
Patient ID: Kathy Beltran, female   DOB: [birth year], 81 y.o.   MRN: [id number]    HPI:  Kathy Beltran is a 81 y.o.   female  referred by Marletta Lor, MD for evaluation of iron deficiency anemia.  Kathy Beltran is a delightful lady who is not been seen regularly by a doctor in years. She has a history of diabetes and hypertension and presented her her primary care physician several weeks ago with weakness, loss of appetite, weight loss,. She was feeling unsteady on her feet and fell and broke her wrist. When she saw her PCP they noticed she looked pale. Stool for occult blood was positive. She reports that she typically moves her bowels fairly regularly but over the past year has been constipated and has to use milk of magnesia once or twice per week. She has no abdominal pain. Her appetite has been diminished and she states she has lost 15-20 pounds in the past 4-6 months unintentionally. She denies epigastric pain, nausea, or vomiting. When seen by her primary care provider she was found to have a hemoglobin of 6 and was admitted and was transfused 2 units of packed cells and advised to follow-up with gastroenterology as an outpatient for colonoscopy. She has never had a colonoscopy or endoscopy. She denies a family history of colon cancer, colon polyps, or inflammatory bowel disease. She denies a prior history of gastritis or ulcers. She reports that she has been having sweats at night for the past 2 months. She denies a cough.   Past Medical History  Diagnosis Date  . DIABETES MELLITUS, TYPE II 12/19/2006  . ANXIETY 12/19/2006  . HYPERTENSION 12/19/2006  . OSTEOARTHRITIS 12/19/2006    Past Surgical History  Procedure Laterality Date  . Hip open reduction      hip fx   Family History  Problem Relation Age of Onset  . Cancer Mother     renal ca - status post nephrectomy  . Diabetes Mother   . Cancer Sister     died of pancreatic ca  . COPD Sister   . Stroke Sister   . Cancer Brother       died of pancreatic ca  . COPD Brother    Social History  Substance Use Topics  . Smoking status: Former Research scientist (life sciences)  . Smokeless tobacco: Never Used  . Alcohol Use: No   Current Outpatient Prescriptions  Medication Sig Dispense Refill  . atorvastatin (LIPITOR) 20 MG tablet TAKE ONE TABLET BY MOUTH ONCE DAILY 90 tablet 1  . ferrous sulfate 325 (65 FE) MG tablet Take 1 tablet (325 mg total) by mouth 3 (three) times daily with meals. 90 tablet 3  . lisinopril (PRINIVIL,ZESTRIL) 20 MG tablet Take 1 tablet (20 mg total) by mouth daily. 30 tablet 3  . LORazepam (ATIVAN) 0.5 MG tablet TAKE ONE TABLET BY MOUTH TWICE DAILY AS NEEDED FOR ANXIETY (Patient taking differently: Take 1 mg by mouth at bedtime. T) 60 tablet 5  . metFORMIN (GLUCOPHAGE-XR) 500 MG 24 hr tablet TAKE ONE TABLET BY MOUTH ONCE DAILY WITH  BREAKFAST 90 tablet 1  . omeprazole (PRILOSEC) 20 MG capsule Take 1 capsule (20 mg total) by mouth daily. 30 mins prior  To breakfast 30 capsule 3   No current facility-administered medications for this visit.   No Known Allergies   Review of Systems: Gen: Denies any fever, chills.  Has felt weak and tired. Has a diminished appetite and has been losing weight. CV: Denies  chest pain, angina, palpitations, syncope, orthopnea, PND, peripheral edema, and claudication. Resp: Denies dyspnea at rest, dyspnea with exercise, cough, sputum, wheezing, coughing up blood, and pleurisy. GI: Denies vomiting blood, jaundice, and fecal incontinence.   Denies dysphagia or odynophagia. GU : Denies urinary burning, blood in urine, urinary frequency, urinary hesitancy, nocturnal urination, and urinary incontinence. MS: Denies joint pain, limitation of movement, and swelling, stiffness, low back pain, extremity pain. Denies muscle weakness, cramps, atrophy.  Derm: Denies rash, itching, dry skin, hives, moles, warts, or unhealing ulcers.  Psych: Denies depression, anxiety, memory loss, suicidal ideation,  hallucinations, paranoia, and confusion. Heme: Denies bruising, bleeding, and enlarged lymph nodes. Neuro:  Denies any headaches, dizziness, paresthesias.   LAB RESULTS: CBC 05/05/2013 white count 7.1, hemoglobin 11, hematocrit 33.6, MCV 81.1, platelets 328,000. CBC 01/13/2015 white count 8.8, hemoglobin 6.3, hematocrit 20.7, MCV 70.6, platelets 522,000. CBC 02/01/2015 white count 7.7, hemoglobin 9.6, hematocrit 29.9, platelets 459,000. MCV 77.6  Physical Exam: BP 188/72 mmHg  Pulse 68  Ht 5\' 6"  (1.676 m)  Wt 119 lb (53.978 kg)  BMI 19.22 kg/m2 Constitutional: Pleasant,well-developed, elderly Caucasian female in no acute distress. HEENT: Normocephalic and atraumatic. Conjunctivae are normal. No scleral icterus. Neck supple. No JVD Cardiovascular: Normal rate, regular rhythm.  Pulmonary/chest: Effort normal and breath sounds normal. No wheezing, rales or rhonchi. Abdominal: Soft, nondistended, nontender. Bowel sounds active throughout. There are no masses palpable. No hepatomegaly. Extremities: no edema Lymphadenopathy: No cervical adenopathy noted. Neurological: Alert and oriented to person place and time. Skin: Skin is warm and dry. No rashes noted. Psychiatric: Normal mood and affect. Behavior is normal.  ASSESSMENT AND PLAN: 81 year old female recently found to have an iron deficiency anemia and heme positive stools amongst a several month history of anorexia, weight loss, and change in bowel habits, referred for evaluation. She will be scheduled for a colonoscopy to evaluate for polyps, neoplasia, AVMs, as well as an EGD to evaluate for esophagitis, gastritis, ulcer, etc.The risks, benefits, and alternatives to colonoscopy with possible biopsy and possible polypectomy were discussed with the patient and they consent to proceed.  The risks, benefits, and alternatives to endoscopy with possible biopsy and possible dilation were discussed with the patient and they consent to proceed.  The procedures will be scheduled with Dr. Carlean Purl. She will also be scheduled for CT of the abdomen and pelvis to evaluate for other possible intra-abdominal pathology that may be causing her weight loss, night sweats, anorexia, and anemia. She will also be given a trial of omeprazole 20 mg 1 by mouth every morning. A repeat CBC and comprehensive metabolic panel will be obtained today. Further recommendations will be made pending the findings of the above.    Jomayra Novitsky, Deloris Ping 02/16/2015, 11:53 AM  CC: Marletta Lor, MD

## 2015-03-08 ENCOUNTER — Encounter (HOSPITAL_COMMUNITY): Payer: Self-pay | Admitting: Internal Medicine

## 2015-03-09 NOTE — Progress Notes (Signed)
Quick Note:  I explained cancer diagnosis to son who will tell her. Next steps are  1) Appointment for consult with Dr. Leighton Ruff re: right colon cancer 2) CBC, CEA, BMET  3) CT chest w/ contrast re: lung nodule ob CT abd/pelvis and look for mets from right colon cancer  Colon recall 1 year for now I have cced her PCP ______

## 2015-03-10 ENCOUNTER — Other Ambulatory Visit: Payer: Self-pay

## 2015-03-10 ENCOUNTER — Other Ambulatory Visit (INDEPENDENT_AMBULATORY_CARE_PROVIDER_SITE_OTHER): Payer: Medicare Other

## 2015-03-10 DIAGNOSIS — C189 Malignant neoplasm of colon, unspecified: Secondary | ICD-10-CM

## 2015-03-10 DIAGNOSIS — R911 Solitary pulmonary nodule: Secondary | ICD-10-CM

## 2015-03-10 LAB — BASIC METABOLIC PANEL
BUN: 19 mg/dL (ref 6–23)
CO2: 27 meq/L (ref 19–32)
Calcium: 10.3 mg/dL (ref 8.4–10.5)
Chloride: 96 mEq/L (ref 96–112)
Creatinine, Ser: 0.66 mg/dL (ref 0.40–1.20)
GFR: 90.41 mL/min (ref >60.00)
GLUCOSE: 315 mg/dL — AB (ref 70–99)
POTASSIUM: 4.2 meq/L (ref 3.5–5.1)
SODIUM: 130 meq/L — AB (ref 135–145)

## 2015-03-10 LAB — CBC WITH DIFFERENTIAL/PLATELET
BASOS ABS: 0 10*3/uL (ref 0.0–0.1)
Basophils Relative: 0.2 % (ref 0.0–3.0)
EOS ABS: 0 10*3/uL (ref 0.0–0.7)
EOS PCT: 0.2 % (ref 0.0–5.0)
HCT: 33.9 % — ABNORMAL LOW (ref 36.0–46.0)
HEMOGLOBIN: 11 g/dL — AB (ref 12.0–15.0)
LYMPHS ABS: 0.7 10*3/uL (ref 0.7–4.0)
Lymphocytes Relative: 8.4 % — ABNORMAL LOW (ref 12.0–46.0)
MCHC: 32.5 g/dL (ref 30.0–36.0)
MCV: 84 fl (ref 78.0–100.0)
MONO ABS: 0.6 10*3/uL (ref 0.1–1.0)
Monocytes Relative: 6.6 % (ref 3.0–12.0)
NEUTROS PCT: 84.6 % — AB (ref 43.0–77.0)
Neutro Abs: 7.4 10*3/uL (ref 1.4–7.7)
Platelets: 372 10*3/uL (ref 150.0–400.0)
RBC: 4.04 Mil/uL (ref 3.87–5.11)
RDW: 26 % — ABNORMAL HIGH (ref 11.5–15.5)
WBC: 8.7 10*3/uL (ref 4.0–10.5)

## 2015-03-10 NOTE — Addendum Note (Signed)
Addended by: Marlon Pel on: 03/10/2015 09:25 AM   Modules accepted: Orders

## 2015-03-11 LAB — CEA: CEA: 2.1 ng/mL (ref 0.0–5.0)

## 2015-03-11 NOTE — Progress Notes (Signed)
Quick Note:  Slight anemia, slightly low sodium but probably pseudohyponatremia from hyperglycemia but glucose not as high before so not sure Let her or son know this and am ccing PCP she may need to be seen re: hyperglycemia in DM ______

## 2015-03-14 ENCOUNTER — Ambulatory Visit (INDEPENDENT_AMBULATORY_CARE_PROVIDER_SITE_OTHER)
Admission: RE | Admit: 2015-03-14 | Discharge: 2015-03-14 | Disposition: A | Discharge status: Home / Self Care | Payer: Medicare Other | Source: Ambulatory Visit | Attending: Internal Medicine | Admitting: Internal Medicine

## 2015-03-14 ENCOUNTER — Other Ambulatory Visit: Payer: Medicare Other

## 2015-03-14 DIAGNOSIS — R911 Solitary pulmonary nodule: Secondary | ICD-10-CM

## 2015-03-14 MED ORDER — IOHEXOL 300 MG/ML  SOLN
80.0000 mL | Freq: Once | INTRAMUSCULAR | Status: AC | PRN
  Administered 2015-03-14: 80 mL via INTRAVENOUS

## 2015-03-15 ENCOUNTER — Telehealth: Payer: Self-pay | Admitting: *Deleted

## 2015-03-15 DIAGNOSIS — C189 Malignant neoplasm of colon, unspecified: Secondary | ICD-10-CM

## 2015-03-15 NOTE — Telephone Encounter (Signed)
Oncology Nurse Navigator Documentation  Oncology Nurse Navigator Flowsheets 03/15/2015  Referral date to RadOnc/MedOnc 03/14/2015  Navigator Encounter Type Introductory phone call  Left VM for son, Vicente Serene to return call regarding new patient appointment for GI Medical Oncology. Provided my direct # to return call.

## 2015-03-15 NOTE — Telephone Encounter (Signed)
error 

## 2015-03-15 NOTE — Addendum Note (Signed)
Addended by: Tania Ade on: 03/15/2015 04:18 PM   Modules accepted: Orders

## 2015-03-15 NOTE — Telephone Encounter (Signed)
Return call from son, Vicente Serene. They want the appointment with Dr. Burr Medico on 03/17/15 at 3 pm. Confirmed location of [place], [place] parking, bring meds and insurance information. They are requesting assistance with finding care assistance/alternatives for their mother. Will arrange for CSW to see them that day also.

## 2015-03-17 ENCOUNTER — Encounter: Payer: Self-pay | Admitting: Hematology

## 2015-03-17 ENCOUNTER — Telehealth: Payer: Self-pay | Admitting: Hematology

## 2015-03-17 ENCOUNTER — Ambulatory Visit (HOSPITAL_BASED_OUTPATIENT_CLINIC_OR_DEPARTMENT_OTHER): Payer: Medicare Other | Admitting: Hematology

## 2015-03-17 ENCOUNTER — Other Ambulatory Visit: Payer: Self-pay | Admitting: *Deleted

## 2015-03-17 VITALS — BP 171/65 | HR 60 | Temp 98.5°F | Resp 20 | Ht 66.0 in | Wt 118.5 lb

## 2015-03-17 DIAGNOSIS — C182 Malignant neoplasm of ascending colon: Secondary | ICD-10-CM | POA: Diagnosis not present

## 2015-03-17 DIAGNOSIS — D123 Benign neoplasm of transverse colon: Secondary | ICD-10-CM

## 2015-03-17 DIAGNOSIS — D122 Benign neoplasm of ascending colon: Secondary | ICD-10-CM

## 2015-03-17 DIAGNOSIS — F0390 Unspecified dementia without behavioral disturbance: Secondary | ICD-10-CM | POA: Diagnosis not present

## 2015-03-17 DIAGNOSIS — K6389 Other specified diseases of intestine: Secondary | ICD-10-CM

## 2015-03-17 DIAGNOSIS — D500 Iron deficiency anemia secondary to blood loss (chronic): Secondary | ICD-10-CM | POA: Diagnosis not present

## 2015-03-17 DIAGNOSIS — E119 Type 2 diabetes mellitus without complications: Secondary | ICD-10-CM | POA: Diagnosis not present

## 2015-03-17 DIAGNOSIS — C189 Malignant neoplasm of colon, unspecified: Secondary | ICD-10-CM

## 2015-03-17 DIAGNOSIS — D124 Benign neoplasm of descending colon: Secondary | ICD-10-CM

## 2015-03-17 DIAGNOSIS — R4182 Altered mental status, unspecified: Secondary | ICD-10-CM

## 2015-03-17 NOTE — Progress Notes (Signed)
[facility]  Telephone:([phone]) [phone] Fax:(336) [phone]  Clinic New Consult Note   Patient Care Team: Marletta Lor, MD as PCP - General 03/17/2015  REFERRAL PHYSICIAN: Dr. Leighton Ruff   CHIEF COMPLAINTS/PURPOSE OF CONSULTATION:  Newly diagnosed right colon cancer   HISTORY OF PRESENTING ILLNESS:  Kathy Beltran 81 y.o. female with PMH of DM, HTN, and moderate dementia,  is here because of recently diagnosed colon cancer. She is referred by her surgeon Dr. Marcello Moores to discuss non-surgical management options. She is accompanied by her two sons to the clinic today.   She presented with worsening fatigue, decreased appetite and 10 lbs weight loss for about one months. She was seen by her primary care physician on 01/13/2015, lab reviewed severe anemia with hemoglobin 6.3 and heme positive stool. She was sent to emergency room and was admitted for blood transfusion. She felt better after blood transfusion, and was discharged home the next day. She was referred to gastroenterologist Dr. Carlean Purl, and underwent colonoscopy on 03/07/2015. A large Circumferential mass in the cecum and ascending colon was found, biopsy showed adenocarcinoma. CT scan was obtained which showed no evidence of distant metastasis. She was seen by colorectal surgeon Dr. Marcello Moores last week, who recommended Hemicolectomy. Her sons are reluctant to proceed with surgery due to her advanced age and dementia, and he requested a medical oncology referral.  She lives alone at home, both of her sons living [place], and one lives 5 minutes away from her house. They check on her a few times a day, and prepare meals for her. She has low apeptites, she eats small meals,  and does drink boost 1-2 a day, needs assistance for her some ADLs, such as shower. According to her son's, her cognitive deficits have progressed significantly since the hospitalization last months, and she has been confused intermittently, with  hallucination and disorientation sometime. They are very concerned if  she can tolerate general anesthesia and her surgery. The patient has lost 15-20 pounds in the past 6 months.  MEDICAL HISTORY:  Past Medical History  Diagnosis Date  . DIABETES MELLITUS, TYPE II 12/19/2006  . ANXIETY 12/19/2006  . HYPERTENSION 12/19/2006  . OSTEOARTHRITIS 12/19/2006  . Shortness of breath dyspnea     SURGICAL HISTORY: Past Surgical History  Procedure Laterality Date  . Hip open reduction      hip fx  . Colonoscopy with propofol N/A 03/07/2015    Procedure: COLONOSCOPY WITH PROPOFOL;  Surgeon: Gatha Mayer, MD;  Location: [facility];  Service: Endoscopy;  Laterality: N/A;    SOCIAL HISTORY: Social History   Social History  . Marital Status: Widowed    Spouse Name: N/A  . Number of Children: 2  . Years of Education: N/A   Occupational History  . Retired    Social History Main Topics  . Smoking status: Former Research scientist (life sciences)  . Smokeless tobacco: Never Used  . Alcohol Use: No  . Drug Use: No  . Sexual Activity: Not on file   Other Topics Concern  . Not on file   Social History Narrative    FAMILY HISTORY: Family History  Problem Relation Age of Onset  . Cancer Mother     renal ca - status post nephrectomy  . Diabetes Mother   . Cancer Sister     died of pancreatic ca  . COPD Sister   . Stroke Sister   . Cancer Brother     died of pancreatic ca  . COPD  Brother     ALLERGIES:  has No Known Allergies.  MEDICATIONS:  Current Outpatient Prescriptions  Medication Sig Dispense Refill  . atorvastatin (LIPITOR) 20 MG tablet TAKE ONE TABLET BY MOUTH ONCE DAILY 90 tablet 1  . ferrous sulfate 325 (65 FE) MG tablet Take 1 tablet (325 mg total) by mouth 3 (three) times daily with meals. (Patient taking differently: Take 325 mg by mouth 2 (two) times daily. ) 90 tablet 3  . lisinopril (PRINIVIL,ZESTRIL) 20 MG tablet Take 1 tablet (20 mg total) by mouth daily. 30 tablet 3  . metFORMIN  (GLUCOPHAGE-XR) 500 MG 24 hr tablet TAKE ONE TABLET BY MOUTH ONCE DAILY WITH  BREAKFAST 90 tablet 1  . Multiple Vitamin (MULTIVITAMIN WITH MINERALS) TABS tablet Take 1 tablet by mouth daily.    Marland Kitchen LORazepam (ATIVAN) 0.5 MG tablet TAKE ONE TABLET BY MOUTH TWICE DAILY AS NEEDED FOR ANXIETY (Patient not taking: Reported on 03/17/2015) 60 tablet 5   No current facility-administered medications for this visit.    REVIEW OF SYSTEMS:   Constitutional: Denies fevers, chills or abnormal night sweats Eyes: Denies blurriness of vision, double vision or watery eyes Ears, nose, mouth, throat, and face: Denies mucositis or sore throat Respiratory: Denies cough, dyspnea or wheezes Cardiovascular: Denies palpitation, chest discomfort or lower extremity swelling Gastrointestinal:  Denies nausea, heartburn or change in bowel habits Skin: Denies abnormal skin rashes Lymphatics: Denies new lymphadenopathy or easy bruising Neurological:Denies numbness, tingling or new weaknesses Behavioral/Psych: Mood is stable, no new changes  All other systems were reviewed with the patient and are negative.  PHYSICAL EXAMINATION: ECOG PERFORMANCE STATUS: 3 - Symptomatic, >50% confined to bed  Filed Vitals:   03/17/15 1450  BP: 171/65  Pulse: 60  Temp: 98.5 F (36.9 C)  Resp: 20   Filed Weights   03/17/15 1450  Weight: 118 lb 8 oz (53.751 kg)    GENERAL:alert, no distress and comfortable SKIN: skin color, texture, turgor are normal, no rashes or significant lesions EYES: normal, conjunctiva are pink and non-injected, sclera clear OROPHARYNX:no exudate, no erythema and lips, buccal mucosa, and tongue normal  NECK: supple, thyroid normal size, non-tender, without nodularity LYMPH:  no palpable lymphadenopathy in the cervical, axillary or inguinal LUNGS: clear to auscultation and percussion with normal breathing effort HEART: regular rate & rhythm and no murmurs and no lower extremity edema ABDOMEN:abdomen soft,  non-tender and normal bowel sounds Musculoskeletal:no cyanosis of digits and no clubbing  PSYCH: alert & oriented x 3 with fluent speech NEURO: no focal motor/sensory deficits  LABORATORY DATA:  I have reviewed the data as listed Lab Results  Component Value Date   WBC 8.7 03/10/2015   HGB 11.0* 03/10/2015   HCT 33.9* 03/10/2015   MCV 84.0 03/10/2015   PLT 372.0 03/10/2015    Recent Labs  01/13/15 1748 01/14/15 0445 02/16/15 0957 03/10/15 1604  NA 126* 130* 128* 130*  K 4.7 4.2 4.4 4.2  CL 95* 100* 95* 96  CO2 25 25 28 27   GLUCOSE 168* 99 117* 315*  BUN 23* 19 14 19   CREATININE 0.69 0.55 0.63 0.66  CALCIUM 10.3 9.7 10.4 10.3  GFRNONAA >60 >60  --   --   GFRAA >60 >60  --   --   PROT  --  6.2* 6.4  --   ALBUMIN  --  3.5 3.6  --   AST  --  23 17  --   ALT  --  14 14  --  ALKPHOS  --  49 60  --   BILITOT  --  1.2 0.5  --    Results for MORENIKE, CUFF (MRN [id number]) as of 03/19/2015 22:58  Ref. Range 03/10/2015 16:04  CEA Latest Ref Range: 0.0-5.0 ng/mL 2.1   PATHOLOGY REPORT Diagnosis 03/07/2015 1. Colon, biopsy, cecal ascending colon mass INVASIVE ADENOCARCINOMA WITH MUCINOUS FEATURES FRAGMENTS OF TUBULAR ADENOMA ADENOMA WITH HIGH GRADE DYSPLASIA 2. Colon, polyp(s), ascending colon polyp TUBULAR ADENOMA WITH HIGH GRADE DYSPLASIA 3. Colon, polyp(s) TUBULAR ADENOMA(X2). HIGH GRADE DYSPLASIA IS NOT IDENTIFIED. Microscopic Comment 1. Diagnosis of malignancy has been confirmed with Dr. Tresa Moore.  RADIOGRAPHIC STUDIES: I have personally reviewed the radiological images as listed and agreed with the findings in the report. Ct Chest W Contrast  03/14/2015  CLINICAL DATA:  New diagnosis of colon cancer. Shortness of breath over the past 6 weeks. Anemia. Abdominal CT demonstrating a left lower lobe pulmonary nodule. EXAM: CT CHEST WITH CONTRAST TECHNIQUE: Multidetector CT imaging of the chest was performed during intravenous contrast administration. CONTRAST:  39mL  OMNIPAQUE IOHEXOL 300 MG/ML  SOLN COMPARISON:  02/21/2015 abdominal pelvic CT. FINDINGS: Mediastinum/Nodes: No supraclavicular adenopathy. Right-sided thyroid nodule measures 3.2 x 2.7 cm on image/series 5/2 Aortic and branch vessel atherosclerosis. Tortuous descending thoracic aorta. Mild cardiomegaly. Multivessel coronary artery atherosclerosis. No central pulmonary embolism, on this non-dedicated study. No mediastinal or hilar adenopathy. Small hiatal hernia. Lungs/Pleura: No pleural fluid. Subpleural thickening in the right middle lobe on image/series 36/3. No suspicious pulmonary nodule or mass. The abdominal CT finding was likely due to posterior lingular volume loss and mild scarring. Example on image/series 48/3. Upper abdomen: Normal imaged portions of the liver, spleen, pancreas, adrenal glands, left kidney. Right renal cyst or minimally complex cyst at 1.0 cm. Abdominal aortic atherosclerosis. Musculoskeletal: Posterior right eighth rib sclerotic lesion is likely a bone island. Mild T12 compression deformity without significant ventral canal encroachment. IMPRESSION: 1. No acute process or evidence of metastatic disease in the chest. The abdominal CT finding was likely due to lingular volume loss and scarring. No suspicious pulmonary nodule or mass. 2.  Atherosclerosis, including within the coronary arteries. 3. Right thyroid mass. This was evaluated on the 03/25/2014 ultrasound and recommended for biopsy. This is of questionable clinical significance, given patient age and comorbidity. Electronically Signed   By: Abigail Miyamoto M.D.   On: 03/14/2015 09:28   Ct Abdomen Pelvis W Contrast  02/21/2015  CLINICAL DATA:  Heme-positive stools, anorexia, weight loss, change in bowel habits, iron deficiency anemia EXAM: CT ABDOMEN AND PELVIS WITH CONTRAST TECHNIQUE: Multidetector CT imaging of the abdomen and pelvis was performed using the standard protocol following bolus administration of intravenous contrast.  CONTRAST:  180mL OMNIPAQUE IOHEXOL 300 MG/ML  SOLN COMPARISON:  None. FINDINGS: There is mild atelectasis at the lung bases. A small nodule peripherally in the left lung base on image number 11 cannot be excluded. The heart is mildly enlarged. Calcification of both the mitral and aortic valves is noted. The liver enhances and only a tiny sub cm cyst is noted in the medial segment of the left lobe of liver. A single gallstone layers within the gallbladder. The pancreas is normal in size and the pancreatic duct is not dilated. The adrenal glands and spleen are unremarkable. The stomach is moderately distended with oral contrast and is unremarkable. No renal calculi are seen. Small low-attenuation rounded renal lesions are present bilaterally consistent with small cysts. On delayed images, the pelvocaliceal systems are unremarkable and  the proximal ureters are normal in caliber. Significant atherosclerotic disease of the abdominal aorta is present without aneurysmal dilatation. An umbilical hernia is noted containing only fat. The urinary bladder is moderately urine distended. The uterus is to the left of midline and unremarkable. No adnexal lesion is seen. No fluid is noted within the pelvis. There are multiple rectosigmoid colon diverticula present. Diverticula also are scattered throughout the more proximal colon. However, there is abnormal soft tissue within the right colon at the level of the ileocecal valve suspicious for tumor. This area measures 3.1 x 5.0 x 4.8 cm worrisome for right colon carcinoma. Only small adjacent lymph nodes are present, the largest measuring 9 mm in short axis diameter. The pericolonic fat planes of the right colon appear preserved. Left hip pinning is noted. The lumbar vertebrae are in normal alignment with probable minimal compression of the anterior superior aspect of T12 and L2. IMPRESSION: 1. Lobular soft tissue mass in the right colon which appears to involve the ileocecal valve  and is worrisome for a right-sided colon carcinoma. An adjacent 9 mm short axis lymph node is present at that site. 2. Multiple diffuse colonic diverticula primarily within the rectosigmoid colon. 3. Questionable nodule in the left lung base. Consider CT of the chest in view of the right-sided colon finding. 4. Single gallstone within the gallbladder. 5. Umbilical hernia containing only fat. Electronically Signed   By: Ivar Drape M.D.   On: 02/21/2015 15:43   COLONOSCOPY Dr. Carlean Purl  ENDOSCOPIC IMPRESSION: 03/07/2015  1. Half circumferential large mass was found in the ascending colon and at the cecum; multiple biopsies were performed using cold forceps 2. Three polyps ranging from 5 to 79mm in size were found in the descending colon, transverse colon, and ascending colon; polypectomies were performed using snare cautery and with a cold snare 3. Diverticulosis was noted throughout the entire examined colon 4. The examination was otherwise normal - good prep   ASSESSMENT & PLAN: 81 year old female with past medical history of diabetes, hypertension, dementia, presented with anorexia, weight loss, fatigue and GI bleeding.   1. Right colon cancer, [id number] -I reviewed her CT scan findings and the biopsy results with her and her sons in details. She appears to have early stage colon cancer, no abnormal size lymphadenopathy on CT scan. Her tumor marker CEA is normal. -We discussed the standard treatment option for early stage : Locally advanced colon cancer is surgical resection, with or without adjuvant chemotherapy. Dr. Marcello Moores feels she is able to tolerate minimally invasive right hemicolectomy and recommended surgery to her. -However her sons are very concerned about her recovery after surgery. She has underline moderate dementia, was in the hospital for one night last months, and her cognitive function and mental status has deteriorated significantly since then, she still had intermittent confusion  and hallucination. I think that she physically might be able to tolerate surgery, however I also concerned her mental deterioration after surgery, which will significantly impact her quality of life and recovery. I think she likely will go to stay in a nursing home for a while after surgery. -Her sons clearly indicated that her quality of life is more important than the quantity of life. They would like to avoid any intervention which may cause her deterioration. -We discussed palliative care and supportive management, including setting up home care service, checking blood counts frequently, and consider blood transfusion and IV iron if needed.  -We discussed the consequences of untreated colon cancer, which include  GI bleeding, obstruction, pain and cancer-related other symptoms, such as fatigue and weight loss.  We discussed the option of stent placement if she develops obstruction. -I also discussed the role of radiation and chemotherapy, which I do not think it's indicated or helpful at this stage.  -After a lengthy discussion, pt's sons decided to wait and watch for now. I will set up home care service to her. Pt is disoriented, not able to make decision for herself.  -check lab by home care service in 2 weeks and I will see her back in 4 weeks for follow up.  2. Iron deficient anemia from GI bleeding -Her ferritin was 9, with low serum iron level and saturation, consistent with iron deficiency -Her anemia has improved after 2 units of blood transfusion last months -She is on oral iron, hemoglobin improved, last week 11  3. DM, HTN -She will continue follow-up with her primary care physician  4. dementia and altered mental status -Her cognitive function has deteriorated, and she is intubated and confused and has hallucination since her hospitalization or months ago, likely related to her dementia and hospitalization. No significant metabolic abnormalities.   Plan: -she will hold on surgery  for now -set up home care service, and have blood draw for CBC, CMP and ferritin in 2 weeks  -I will see her back in 4 weeks    All questions were answered. The patient knows to call the clinic with any problems, questions or concerns. I spent 55 minutes counseling the patient face to face. The total time spent in the appointment was 60 minutes and more than 50% was on counseling.     Truitt Merle, MD 03/17/2015 3:26 PM

## 2015-03-17 NOTE — Telephone Encounter (Signed)
per pof to sch pt appt-gave pt copy of avs °

## 2015-03-18 ENCOUNTER — Encounter: Payer: Self-pay | Admitting: *Deleted

## 2015-03-18 ENCOUNTER — Telehealth: Payer: Self-pay | Admitting: *Deleted

## 2015-03-18 NOTE — Progress Notes (Signed)
Oncology Nurse Navigator Documentation  Oncology Nurse Navigator Flowsheets 03/17/2015  Referral date to RadOnc/MedOnc 03/14/2015  Navigator Encounter Type Initial MedOnc  Patient Visit Type Medonc  Treatment Phase Abnormal Scans  Barriers/Navigation Needs Family concerns;Education  Education Understanding Cancer/ Treatment Options;Coping with Diagnosis/ Prognosis;Pain/ Symptom Management;Other;Newly Diagnosed Cancer Education  Interventions Referrals  Referrals Social Work;Home Health  Support Groups/Services [place] Support network  Time Spent with Patient 60  Met with patient and sons, Vicente Serene and Annie Main during new patient visit. Explained the role of the GI Nurse Navigator and provided New Patient Packet with information on: 1. Colon cancer 2. Support groups 3. Advanced Directives 4. Fall Safety Plan Answered questions, reviewed current treatment plan using TEACH back and provided emotional support. Provided copy of current treatment plan. Had CSW meet with her regarding information on home care/assistance and how to apply for medicaid or SNF. [place] nurse liaison to alert her to new referral. Reviewed with family s/s of bowel obstruction and perforation and to take to ED.  Merceda Elks, RN, BSN GI Oncology [place]

## 2015-03-18 NOTE — Telephone Encounter (Signed)
  Oncology Nurse Navigator Documentation    Navigator Encounter Type: Telephone (03/18/15 1507) : Confirmed with [facility] that referral was received and services are scheduled to begin tomorrow.

## 2015-03-18 NOTE — Progress Notes (Signed)
[place] Work  Clinical Social Work met with pt and her two sons; Vicente Serene and Elta Guadeloupe prior to meeting with Dr Burr Medico. CSW introduced self and explained role of CSW/Patient and Family Support Team. Sons had many caregiving questions as pt lives alone currently, around the corner from son, Vicente Serene. CSW explained levels of care available in the community and payor source/requirements for each one. CSW provided pt and sons with handouts on CNA agencies, ALFs, SNFs, adult day care and home care options. Sons plan to apply for MCD in the near future as pt does not own home and has very limited funds for her care. They agree to reach out to [facility] team if they have further questions. Treatment plan appears pending.   Clinical Social Work interventions: Resource education Supportive listening  Loren Racer, [place] Worker [place]  [place] Phone: [phone] Fax: [phone]

## 2015-03-19 ENCOUNTER — Encounter: Payer: Self-pay | Admitting: Hematology

## 2015-03-21 ENCOUNTER — Telehealth: Payer: Self-pay | Admitting: *Deleted

## 2015-03-21 ENCOUNTER — Telehealth: Payer: Self-pay

## 2015-03-21 NOTE — Telephone Encounter (Signed)
Received ph call from [facility] stating [facility] RN, Katharine Look made referral & wants to know if pt apropriate & would Dr Burr Medico be attending & does she want symptom management.  Informed that Dr Burr Medico agrees appropriate, yes to attending & yes to symptom management assistance.

## 2015-03-21 NOTE — Telephone Encounter (Signed)
Pt is being admitted to hospice today and Elmyra Ricks called requesting a DNR order from Dr Burr Medico for the pt. Order given and OK for hospice MD to sign.

## 2015-03-22 ENCOUNTER — Encounter: Payer: Medicare Other | Admitting: Internal Medicine

## 2015-03-24 ENCOUNTER — Encounter: Payer: Medicare Other | Admitting: Internal Medicine

## 2015-03-29 ENCOUNTER — Other Ambulatory Visit: Payer: Self-pay | Admitting: Hematology

## 2015-04-05 ENCOUNTER — Encounter: Payer: Medicare Other | Admitting: Internal Medicine

## 2015-04-20 ENCOUNTER — Encounter: Payer: Medicare Other | Admitting: Internal Medicine

## 2015-04-20 ENCOUNTER — Telehealth: Payer: Self-pay | Admitting: Internal Medicine

## 2015-04-20 DIAGNOSIS — Z0289 Encounter for other administrative examinations: Secondary | ICD-10-CM

## 2015-04-20 MED ORDER — LORAZEPAM 0.5 MG PO TABS: ORAL_TABLET | ORAL | Status: DC

## 2015-04-20 MED ORDER — ATORVASTATIN CALCIUM 20 MG PO TABS: 20.0000 mg | ORAL_TABLET | Freq: Every day | ORAL | Status: DC

## 2015-04-20 NOTE — Telephone Encounter (Signed)
ok 

## 2015-04-20 NOTE — Telephone Encounter (Signed)
Rx called in to pharmacy. 

## 2015-04-20 NOTE — Telephone Encounter (Signed)
Pt request Lorazepam 0.5mg   Pt last visit 02/01/15 Pt last Rx refill 09/16/14 #60 with 5 refills    Please advise

## 2015-04-22 ENCOUNTER — Telehealth: Payer: Self-pay | Admitting: *Deleted

## 2015-04-22 MED ORDER — METFORMIN HCL ER 500 MG PO TB24: ORAL_TABLET | ORAL | Status: DC

## 2015-04-22 NOTE — Telephone Encounter (Signed)
[facility] faxed a refill request for refills on: Benazep/HCTZ 20-12.5mg  #90-take 1 by mouth once a day.

## 2015-04-25 NOTE — Telephone Encounter (Signed)
Agree-do not RF

## 2015-04-25 NOTE — Telephone Encounter (Signed)
Pt is not longer in this medication.  Please advise

## 2015-04-26 ENCOUNTER — Telehealth: Payer: Self-pay | Admitting: Internal Medicine

## 2015-04-26 MED ORDER — LISINOPRIL 20 MG PO TABS: 20.0000 mg | ORAL_TABLET | Freq: Every day | ORAL | Status: DC

## 2015-04-26 NOTE — Telephone Encounter (Signed)
Refills was sent the pharmacy

## 2015-04-26 NOTE — Telephone Encounter (Signed)
Yes, lisinopril has been substituted

## 2015-04-26 NOTE — Telephone Encounter (Signed)
Please advise 

## 2015-04-26 NOTE — Telephone Encounter (Signed)
Pt son is calling concerning bp med benazepril -hctz  that was denied. Pt son would like to know has that bp med been replace with another bp med. [facility] [address]

## 2015-04-27 ENCOUNTER — Other Ambulatory Visit: Payer: Self-pay

## 2015-04-27 ENCOUNTER — Telehealth: Payer: Self-pay | Admitting: *Deleted

## 2015-04-27 MED ORDER — LISINOPRIL 20 MG PO TABS: 20.0000 mg | ORAL_TABLET | Freq: Every day | ORAL | Status: DC

## 2015-04-27 NOTE — Telephone Encounter (Signed)
Received vm call from [place] stating that pt's son is considering surgery for his mom.  She suggested that he discuss with Dr Burr Medico pt's options & life expectancy.  She just wanted to give Dr Burr Medico a heads up in case son calls.  Message given to Dr Burr Medico.

## 2015-04-28 ENCOUNTER — Encounter: Payer: Medicare Other | Admitting: Hematology

## 2015-04-28 ENCOUNTER — Other Ambulatory Visit: Payer: Medicare Other

## 2015-04-28 NOTE — Progress Notes (Signed)
This encounter was created in error - please disregard.

## 2015-05-04 ENCOUNTER — Telehealth: Payer: Self-pay | Admitting: *Deleted

## 2015-05-04 NOTE — Telephone Encounter (Signed)
[facility] on [address] faxed a refill request for Benazep/HCTZ 20-12.5mg -take one tablet by mouth once daily- #90.  I did not see this on the pts current medication list.

## 2015-05-05 NOTE — Telephone Encounter (Signed)
Will call pharmacy after 9:00 when they open.

## 2015-05-05 NOTE — Telephone Encounter (Signed)
Did you contact the pharmacy?

## 2015-05-05 NOTE — Telephone Encounter (Signed)
Pt is no longer on medication only Lisinopril.

## 2015-05-06 NOTE — Telephone Encounter (Signed)
[place] and spoke to [place], told her pt is no longer on Benazipril/HCTZ Rx has been cancelled. Pt is on Lisinopril. Magda Paganini verbalized understanding and will cancel order.

## 2015-06-28 ENCOUNTER — Ambulatory Visit (INDEPENDENT_AMBULATORY_CARE_PROVIDER_SITE_OTHER): Payer: Medicare Other | Admitting: Internal Medicine

## 2015-06-28 ENCOUNTER — Encounter: Payer: Self-pay | Admitting: Internal Medicine

## 2015-06-28 VITALS — BP 138/70 | HR 71 | Temp 98.1°F | Resp 18 | Ht 66.0 in | Wt 121.0 lb

## 2015-06-28 DIAGNOSIS — I10 Essential (primary) hypertension: Secondary | ICD-10-CM | POA: Diagnosis not present

## 2015-06-28 DIAGNOSIS — C182 Malignant neoplasm of ascending colon: Secondary | ICD-10-CM

## 2015-06-28 DIAGNOSIS — E0840 Diabetes mellitus due to underlying condition with diabetic neuropathy, unspecified: Secondary | ICD-10-CM

## 2015-06-28 DIAGNOSIS — C189 Malignant neoplasm of colon, unspecified: Secondary | ICD-10-CM | POA: Insufficient documentation

## 2015-06-28 NOTE — Patient Instructions (Signed)
Continue iron therapy  Encourage caloric intake  Return in 3 months for follow-up

## 2015-06-28 NOTE — Progress Notes (Signed)
Subjective:    Patient ID: Kathy Beltran, female    DOB: 1928/03/30, 82 y.o.   MRN: [id number]  HPI Lab Results  Component Value Date   HGBA1C 6.7* 01/13/2015   BP Readings from Last 3 Encounters:  06/28/15 138/70  03/17/15 171/65  03/07/15 186/41   Wt Readings from Last 3 Encounters:  06/28/15 121 lb (54.885 kg)  03/17/15 118 lb 8 oz (53.751 kg)  03/07/15 109 lb (49.35 kg)   82 year old patient who was diagnosed with severe iron deficiency anemia in the fall of last year.  Evaluation confirmed cecal cancer.  Due to dementia and multiple comorbidities, surgery was deferred.  The patient remained stable with nice weight gain, although her appetite remains poor.  She is on multiple supplements. Denies any pain Accompanied by her son today  Past Medical History  Diagnosis Date  . DIABETES MELLITUS, TYPE II 12/19/2006  . ANXIETY 12/19/2006  . HYPERTENSION 12/19/2006  . OSTEOARTHRITIS 12/19/2006  . Shortness of breath dyspnea     Social History   Social History  . Marital Status: Widowed    Spouse Name: N/A  . Number of Children: 2  . Years of Education: N/A   Occupational History  . Retired    Social History Main Topics  . Smoking status: Never Smoker   . Smokeless tobacco: Never Used  . Alcohol Use: No  . Drug Use: No  . Sexual Activity: Not on file   Other Topics Concern  . Not on file   Social History Narrative    Past Surgical History  Procedure Laterality Date  . Hip open reduction      hip fx  . Colonoscopy with propofol N/A 03/07/2015    Procedure: COLONOSCOPY WITH PROPOFOL;  Surgeon: Gatha Mayer, MD;  Location: [facility];  Service: Endoscopy;  Laterality: N/A;    Family History  Problem Relation Age of Onset  . Cancer Mother     renal ca - status post nephrectomy  . Diabetes Mother   . COPD Sister   . Stroke Sister   . Cancer Brother     died of pancreatic ca  . COPD Brother   . Cancer Father     lung cancer     No Known  Allergies  Current Outpatient Prescriptions on File Prior to Visit  Medication Sig Dispense Refill  . atorvastatin (LIPITOR) 20 MG tablet Take 1 tablet (20 mg total) by mouth daily. 90 tablet 1  . ferrous sulfate 325 (65 FE) MG tablet Take 1 tablet (325 mg total) by mouth 3 (three) times daily with meals. (Patient taking differently: Take 325 mg by mouth 2 (two) times daily. ) 90 tablet 3  . lisinopril (PRINIVIL,ZESTRIL) 20 MG tablet Take 1 tablet (20 mg total) by mouth daily. 30 tablet 5  . LORazepam (ATIVAN) 0.5 MG tablet TAKE ONE TABLET BY MOUTH TWICE DAILY AS NEEDED FOR ANXIETY 60 tablet 5  . metFORMIN (GLUCOPHAGE-XR) 500 MG 24 hr tablet TAKE ONE TABLET BY MOUTH ONCE DAILY WITH  BREAKFAST 90 tablet 1  . Multiple Vitamin (MULTIVITAMIN WITH MINERALS) TABS tablet Take 1 tablet by mouth daily.     No current facility-administered medications on file prior to visit.    BP 138/70 mmHg  Pulse 71  Temp(Src) 98.1 F (36.7 C) (Oral)  Resp 18  Ht 5\' 6"  (1.676 m)  Wt 121 lb (54.885 kg)  BMI 19.54 kg/m2  SpO2 98%     Review of Systems  Constitutional: Positive for activity change, appetite change and fatigue.  HENT: Negative for congestion, dental problem, hearing loss, rhinorrhea, sinus pressure, sore throat and tinnitus.   Eyes: Negative for pain, discharge and visual disturbance.  Respiratory: Negative for cough and shortness of breath.   Cardiovascular: Negative for chest pain, palpitations and leg swelling.  Gastrointestinal: Negative for nausea, vomiting, abdominal pain, diarrhea, constipation, blood in stool and abdominal distention.  Genitourinary: Negative for dysuria, urgency, frequency, hematuria, flank pain, vaginal bleeding, vaginal discharge, difficulty urinating, vaginal pain and pelvic pain.  Musculoskeletal: Negative for joint swelling, arthralgias and gait problem.  Skin: Negative for rash.  Neurological: Positive for weakness. Negative for dizziness, syncope, speech  difficulty, numbness and headaches.  Hematological: Negative for adenopathy.  Psychiatric/Behavioral: Negative for behavioral problems, dysphoric mood and agitation. The patient is not nervous/anxious.        Objective:   Physical Exam  Constitutional: She is oriented to person, place, and time. She appears well-developed and well-nourished.  Elderly, frail with moderate dementia Blood pressure normal Weight 121  HENT:  Head: Normocephalic.  Right Ear: External ear normal.  Left Ear: External ear normal.  Mouth/Throat: Oropharynx is clear and moist.  Eyes: Conjunctivae and EOM are normal. Pupils are equal, round, and reactive to light.  Neck: Normal range of motion. Neck supple. No thyromegaly present.  Cardiovascular: Normal rate, regular rhythm, normal heart sounds and intact distal pulses.   Pulmonary/Chest: Effort normal and breath sounds normal.  Abdominal: Soft. Bowel sounds are normal. She exhibits no mass. There is no tenderness.  Musculoskeletal: Normal range of motion.  Lymphadenopathy:    She has no cervical adenopathy.  Neurological: She is alert and oriented to person, place, and time.  Skin: Skin is warm and dry. No rash noted.  Psychiatric: She has a normal mood and affect. Her behavior is normal.          Assessment & Plan:   Colon cancer Hypertension, stable History severe iron deficiency anemia.  Will check a CBC Diabetes.  Check hemoglobin A1c  Recheck 3 months

## 2015-06-28 NOTE — Progress Notes (Signed)
Pre visit review using our clinic review tool, if applicable. No additional management support is needed unless otherwise documented below in the visit note. 

## 2015-06-29 LAB — CBC WITH DIFFERENTIAL/PLATELET
BASOS ABS: 0 10*3/uL (ref 0.0–0.1)
Basophils Relative: 0.1 % (ref 0.0–3.0)
EOS ABS: 0.2 10*3/uL (ref 0.0–0.7)
Eosinophils Relative: 3.2 % (ref 0.0–5.0)
HEMATOCRIT: 33.3 % — AB (ref 36.0–46.0)
Hemoglobin: 11.1 g/dL — ABNORMAL LOW (ref 12.0–15.0)
LYMPHS PCT: 18.7 % (ref 12.0–46.0)
Lymphs Abs: 1.2 10*3/uL (ref 0.7–4.0)
MCHC: 33.2 g/dL (ref 30.0–36.0)
MCV: 90.3 fl (ref 78.0–100.0)
MONO ABS: 0.6 10*3/uL (ref 0.1–1.0)
Monocytes Relative: 8.7 % (ref 3.0–12.0)
NEUTROS ABS: 4.5 10*3/uL (ref 1.4–7.7)
Neutrophils Relative %: 69.3 % (ref 43.0–77.0)
PLATELETS: 298 10*3/uL (ref 150.0–400.0)
RBC: 3.69 Mil/uL — ABNORMAL LOW (ref 3.87–5.11)
RDW: 14.4 % (ref 11.5–15.5)
WBC: 6.5 10*3/uL (ref 4.0–10.5)

## 2015-06-29 LAB — COMPREHENSIVE METABOLIC PANEL
ALK PHOS: 66 U/L (ref 39–117)
ALT: 28 U/L (ref 0–35)
AST: 31 U/L (ref 0–37)
Albumin: 3.7 g/dL (ref 3.5–5.2)
BILIRUBIN TOTAL: 0.3 mg/dL (ref 0.2–1.2)
BUN: 26 mg/dL — ABNORMAL HIGH (ref 6–23)
CALCIUM: 10.7 mg/dL — AB (ref 8.4–10.5)
CO2: 30 mEq/L (ref 19–32)
CREATININE: 0.58 mg/dL (ref 0.40–1.20)
Chloride: 100 mEq/L (ref 96–112)
GFR: 104.88 mL/min (ref >60.00)
GLUCOSE: 130 mg/dL — AB (ref 70–99)
Potassium: 5.5 mEq/L — ABNORMAL HIGH (ref 3.5–5.1)
Sodium: 134 mEq/L — ABNORMAL LOW (ref 135–145)
TOTAL PROTEIN: 6.2 g/dL (ref 6.0–8.3)

## 2015-06-29 LAB — HEMOGLOBIN A1C: Hgb A1c MFr Bld: 5.9 % (ref 4.6–6.5)

## 2015-08-31 ENCOUNTER — Telehealth: Payer: Self-pay | Admitting: *Deleted

## 2015-08-31 NOTE — Telephone Encounter (Signed)
Received call from [facility] RN/Hospice/[place] stating that pt is d/c today from hospice b/c she doesn't meet the criteria & will be f/u with palliative care.  Message to Dr Burr Medico.

## 2015-10-31 ENCOUNTER — Other Ambulatory Visit: Payer: Self-pay | Admitting: Family Medicine

## 2015-10-31 NOTE — Telephone Encounter (Signed)
Rx refill sent to pharmacy. 

## 2015-11-02 ENCOUNTER — Other Ambulatory Visit: Payer: Self-pay

## 2015-11-02 MED ORDER — ATORVASTATIN CALCIUM 20 MG PO TABS: 20.0000 mg | ORAL_TABLET | Freq: Every day | ORAL | Status: DC

## 2015-11-24 ENCOUNTER — Other Ambulatory Visit: Payer: Self-pay | Admitting: Family Medicine

## 2015-11-24 ENCOUNTER — Other Ambulatory Visit: Payer: Self-pay | Admitting: Emergency Medicine

## 2015-11-24 MED ORDER — METFORMIN HCL ER 500 MG PO TB24: ORAL_TABLET | ORAL | Status: DC

## 2016-01-18 ENCOUNTER — Ambulatory Visit (INDEPENDENT_AMBULATORY_CARE_PROVIDER_SITE_OTHER): Payer: Medicare Other | Admitting: Internal Medicine

## 2016-01-18 ENCOUNTER — Encounter: Payer: Self-pay | Admitting: Internal Medicine

## 2016-01-18 VITALS — BP 140/72 | HR 79 | Temp 98.6°F | Resp 18 | Ht 66.0 in | Wt 134.2 lb

## 2016-01-18 DIAGNOSIS — I10 Essential (primary) hypertension: Secondary | ICD-10-CM

## 2016-01-18 DIAGNOSIS — R4189 Other symptoms and signs involving cognitive functions and awareness: Secondary | ICD-10-CM

## 2016-01-18 DIAGNOSIS — E119 Type 2 diabetes mellitus without complications: Secondary | ICD-10-CM

## 2016-01-18 DIAGNOSIS — Z23 Encounter for immunization: Secondary | ICD-10-CM | POA: Diagnosis not present

## 2016-01-18 DIAGNOSIS — D649 Anemia, unspecified: Secondary | ICD-10-CM

## 2016-01-18 DIAGNOSIS — C189 Malignant neoplasm of colon, unspecified: Secondary | ICD-10-CM

## 2016-01-18 DIAGNOSIS — E0840 Diabetes mellitus due to underlying condition with diabetic neuropathy, unspecified: Secondary | ICD-10-CM

## 2016-01-18 MED ORDER — ATORVASTATIN CALCIUM 20 MG PO TABS: 20.0000 mg | ORAL_TABLET | Freq: Every day | ORAL | 3 refills | Status: DC

## 2016-01-18 MED ORDER — FERROUS SULFATE 325 (65 FE) MG PO TABS: 325.0000 mg | ORAL_TABLET | Freq: Three times a day (TID) | ORAL | 3 refills | Status: DC

## 2016-01-18 NOTE — Progress Notes (Signed)
Pre visit review using our clinic review tool, if applicable. No additional management support is needed unless otherwise documented below in the visit note. 

## 2016-01-18 NOTE — Progress Notes (Signed)
Subjective:    Patient ID: Kathy Beltran, female    DOB: 10-Oct-1927, 82 y.o.   MRN: [id number]  HPI  82 year old patient who was diagnosed with cecal cancer approximately one year ago. It was elected not to have surgery. She remains quite stable.  There is been some nice weight gain since her diagnosis, but still a poor appetite. She has hypertension and diabetes which has been stable Remains on iron supplements. Denies any abdominal pain, nausea or vomiting  Past Medical History:  Diagnosis Date  . ANXIETY 12/19/2006  . DIABETES MELLITUS, TYPE II 12/19/2006  . HYPERTENSION 12/19/2006  . OSTEOARTHRITIS 12/19/2006  . Shortness of breath dyspnea      Social History   Social History  . Marital status: Widowed    Spouse name: N/A  . Number of children: 2  . Years of education: N/A   Occupational History  . Retired    Social History Main Topics  . Smoking status: Never Smoker  . Smokeless tobacco: Never Used  . Alcohol use No  . Drug use: No  . Sexual activity: Not on file   Other Topics Concern  . Not on file   Social History Narrative  . No narrative on file    Past Surgical History:  Procedure Laterality Date  . COLONOSCOPY WITH PROPOFOL N/A 03/07/2015   Procedure: COLONOSCOPY WITH PROPOFOL;  Surgeon: Gatha Mayer, MD;  Location: [facility];  Service: Endoscopy;  Laterality: N/A;  . HIP OPEN REDUCTION     hip fx    Family History  Problem Relation Age of Onset  . Cancer Mother     renal ca - status post nephrectomy  . Diabetes Mother   . COPD Sister   . Stroke Sister   . Cancer Brother     died of pancreatic ca  . COPD Brother   . Cancer Father     lung cancer     No Known Allergies  Current Outpatient Prescriptions on File Prior to Visit  Medication Sig Dispense Refill  . lisinopril (PRINIVIL,ZESTRIL) 20 MG tablet TAKE ONE TABLET BY MOUTH ONCE DAILY. 30 tablet 5  . metFORMIN (GLUCOPHAGE-XR) 500 MG 24 hr tablet TAKE ONE TABLET BY MOUTH ONCE  DAILY WITH  BREAKFAST 90 tablet 1  . Multiple Vitamin (MULTIVITAMIN WITH MINERALS) TABS tablet Take 1 tablet by mouth as needed.     Marland Kitchen LORazepam (ATIVAN) 0.5 MG tablet TAKE ONE TABLET BY MOUTH TWICE DAILY AS NEEDED FOR ANXIETY (Patient not taking: Reported on 01/18/2016) 60 tablet 5   No current facility-administered medications on file prior to visit.     BP 140/72 (BP Location: Left Arm, Patient Position: Sitting, Cuff Size: Normal)   Pulse 79   Temp 98.6 F (37 C) (Oral)   Resp 18   Ht 5\' 6"  (1.676 m)   Wt 134 lb 4 oz (60.9 kg)   SpO2 98%   BMI 21.67 kg/m     Review of Systems  Constitutional: Negative.   HENT: Negative for congestion, dental problem, hearing loss, rhinorrhea, sinus pressure, sore throat and tinnitus.   Eyes: Negative for pain, discharge and visual disturbance.  Respiratory: Negative for cough and shortness of breath.   Cardiovascular: Negative for chest pain, palpitations and leg swelling.  Gastrointestinal: Negative for abdominal distention, abdominal pain, blood in stool, constipation, diarrhea, nausea and vomiting.  Genitourinary: Negative for difficulty urinating, dysuria, flank pain, frequency, hematuria, pelvic pain, urgency, vaginal bleeding, vaginal discharge and vaginal pain.  Musculoskeletal: Negative for arthralgias, gait problem and joint swelling.  Skin: Negative for rash.  Neurological: Negative for dizziness, syncope, speech difficulty, weakness, numbness and headaches.  Hematological: Negative for adenopathy.  Psychiatric/Behavioral: Positive for confusion and decreased concentration. Negative for agitation, behavioral problems and dysphoric mood. The patient is not nervous/anxious.        Objective:   Physical Exam  Constitutional: She is oriented to person, place, and time. She appears well-developed and well-nourished.  HENT:  Head: Normocephalic.  Right Ear: External ear normal.  Left Ear: External ear normal.  Mouth/Throat: Oropharynx  is clear and moist.  Eyes: Conjunctivae and EOM are normal. Pupils are equal, round, and reactive to light.  Neck: Normal range of motion. Neck supple. No thyromegaly present.  Cardiovascular: Normal rate, regular rhythm, normal heart sounds and intact distal pulses.   Pulmonary/Chest: Effort normal and breath sounds normal.  Abdominal: Soft. Bowel sounds are normal. She exhibits no mass. There is no tenderness.  Musculoskeletal: Normal range of motion.  Lymphadenopathy:    She has no cervical adenopathy.  Neurological: She is alert and oriented to person, place, and time.  Skin: Skin is warm and dry. No rash noted.  Psychiatric: She has a normal mood and affect. Her behavior is normal.          Assessment & Plan:   History of cecal cancer History of iron deficiency anemia.  Will check CBC.  Continue iron supplements Essential hypertension, stable off diuretic therapy Diabetes mellitus.  Well-controlled on metformin.  Check hemoglobin A1c  Flu vaccine administered Recheck 6 months or as needed  [employer]

## 2016-01-18 NOTE — Patient Instructions (Signed)
Limit your sodium (Salt) intake  Return in 6 months for follow-up  

## 2016-04-28 ENCOUNTER — Other Ambulatory Visit: Payer: Self-pay | Admitting: Internal Medicine

## 2016-06-05 ENCOUNTER — Other Ambulatory Visit: Payer: Self-pay | Admitting: Internal Medicine

## 2016-07-08 ENCOUNTER — Other Ambulatory Visit: Payer: Self-pay | Admitting: Internal Medicine

## 2016-07-19 ENCOUNTER — Ambulatory Visit (INDEPENDENT_AMBULATORY_CARE_PROVIDER_SITE_OTHER): Payer: Medicare Other | Admitting: Internal Medicine

## 2016-07-19 ENCOUNTER — Encounter: Payer: Self-pay | Admitting: Internal Medicine

## 2016-07-19 VITALS — BP 146/72 | HR 110 | Temp 97.9°F | Ht 66.0 in | Wt 122.8 lb

## 2016-07-19 DIAGNOSIS — I10 Essential (primary) hypertension: Secondary | ICD-10-CM

## 2016-07-19 DIAGNOSIS — R4189 Other symptoms and signs involving cognitive functions and awareness: Secondary | ICD-10-CM

## 2016-07-19 DIAGNOSIS — C189 Malignant neoplasm of colon, unspecified: Secondary | ICD-10-CM

## 2016-07-19 DIAGNOSIS — D649 Anemia, unspecified: Secondary | ICD-10-CM | POA: Diagnosis not present

## 2016-07-19 DIAGNOSIS — E0840 Diabetes mellitus due to underlying condition with diabetic neuropathy, unspecified: Secondary | ICD-10-CM | POA: Diagnosis not present

## 2016-07-19 LAB — CBC WITH DIFFERENTIAL/PLATELET
BASOS PCT: 0.4 % (ref 0.0–3.0)
Basophils Absolute: 0 10*3/uL (ref 0.0–0.1)
EOS PCT: 1.7 % (ref 0.0–5.0)
Eosinophils Absolute: 0.1 10*3/uL (ref 0.0–0.7)
HCT: 32.8 % — ABNORMAL LOW (ref 36.0–46.0)
Hemoglobin: 10.8 g/dL — ABNORMAL LOW (ref 12.0–15.0)
LYMPHS ABS: 0.7 10*3/uL (ref 0.7–4.0)
Lymphocytes Relative: 9.3 % — ABNORMAL LOW (ref 12.0–46.0)
MCHC: 32.9 g/dL (ref 30.0–36.0)
MCV: 82 fl (ref 78.0–100.0)
MONOS PCT: 12.3 % — AB (ref 3.0–12.0)
Monocytes Absolute: 1 10*3/uL (ref 0.1–1.0)
NEUTROS PCT: 76.3 % (ref 43.0–77.0)
Neutro Abs: 5.9 10*3/uL (ref 1.4–7.7)
Platelets: 459 10*3/uL — ABNORMAL HIGH (ref 150.0–400.0)
RBC: 4 Mil/uL (ref 3.87–5.11)
RDW: 14.4 % (ref 11.5–15.5)
WBC: 7.7 10*3/uL (ref 4.0–10.5)

## 2016-07-19 LAB — COMPREHENSIVE METABOLIC PANEL
ALK PHOS: 69 U/L (ref 39–117)
ALT: 10 U/L (ref 0–35)
AST: 14 U/L (ref 0–37)
Albumin: 3 g/dL — ABNORMAL LOW (ref 3.5–5.2)
BUN: 16 mg/dL (ref 6–23)
CHLORIDE: 96 meq/L (ref 96–112)
CO2: 28 meq/L (ref 19–32)
Calcium: 10.6 mg/dL — ABNORMAL HIGH (ref 8.4–10.5)
Creatinine, Ser: 0.75 mg/dL (ref 0.40–1.20)
GFR: 77.76 mL/min (ref >60.00)
GLUCOSE: 295 mg/dL — AB (ref 70–99)
POTASSIUM: 4.2 meq/L (ref 3.5–5.1)
SODIUM: 130 meq/L — AB (ref 135–145)
TOTAL PROTEIN: 5.6 g/dL — AB (ref 6.0–8.3)
Total Bilirubin: 0.6 mg/dL (ref 0.2–1.2)

## 2016-07-19 LAB — HEMOGLOBIN A1C: Hgb A1c MFr Bld: 7.1 % — ABNORMAL HIGH (ref 4.6–6.5)

## 2016-07-19 NOTE — Patient Instructions (Signed)
Discontinue atorvastatin  Return in 3 months for follow-up

## 2016-07-19 NOTE — Progress Notes (Signed)
Pre visit review using our clinic review tool, if applicable. No additional management support is needed unless otherwise documented below in the visit note. 

## 2016-07-19 NOTE — Progress Notes (Signed)
Subjective:    Patient ID: Kathy Beltran, female    DOB: [birth year], 83 y.o.   MRN: [id number]  HPI  83 year old patient who has a history of cecal carcinoma diagnosed approximately a year and a half ago.  Due to multiple comorbidities, supportive care only desired. She has declined over the past 2 weeks with decreased appetite.  She also describes some vague abdominal pain but no nausea or vomiting.  She is sleeping more throughout the day She continues to live alone but her son is very close and checks on her frequently throughout the day She has become a bit more unsteady but no history of falls She was seen 6 months ago, but failed to go to the lab for a lab draw.  Past Medical History:  Diagnosis Date  . ANXIETY 12/19/2006  . DIABETES MELLITUS, TYPE II 12/19/2006  . HYPERTENSION 12/19/2006  . OSTEOARTHRITIS 12/19/2006  . Shortness of breath dyspnea      Social History   Social History  . Marital status: Widowed    Spouse name: N/A  . Number of children: 2  . Years of education: N/A   Occupational History  . Retired    Social History Main Topics  . Smoking status: Never Smoker  . Smokeless tobacco: Never Used  . Alcohol use No  . Drug use: No  . Sexual activity: Not on file   Other Topics Concern  . Not on file   Social History Narrative  . No narrative on file    Past Surgical History:  Procedure Laterality Date  . COLONOSCOPY WITH PROPOFOL N/A 03/07/2015   Procedure: COLONOSCOPY WITH PROPOFOL;  Surgeon: Gatha Mayer, MD;  Location: [facility];  Service: Endoscopy;  Laterality: N/A;  . HIP OPEN REDUCTION     hip fx    Family History  Problem Relation Age of Onset  . Cancer Mother     renal ca - status post nephrectomy  . Diabetes Mother   . COPD Sister   . Stroke Sister   . Cancer Brother     died of pancreatic ca  . COPD Brother   . Cancer Father     lung cancer     No Known Allergies  Current Outpatient Prescriptions on File Prior to Visit   Medication Sig Dispense Refill  . atorvastatin (LIPITOR) 20 MG tablet Take 1 tablet (20 mg total) by mouth daily. 90 tablet 3  . Ferrous Sulfate (IRON) 325 (65 Fe) MG TABS TAKE ONE TABLET BY MOUTH THREE TIMES DAILY WITH MEALS 90 each 3  . lisinopril (PRINIVIL,ZESTRIL) 20 MG tablet TAKE ONE TABLET BY MOUTH ONCE DAILY 30 tablet 5  . LORazepam (ATIVAN) 0.5 MG tablet TAKE ONE TABLET BY MOUTH TWICE DAILY AS NEEDED FOR ANXIETY (Patient not taking: Reported on 01/18/2016) 60 tablet 5  . metFORMIN (GLUCOPHAGE-XR) 500 MG 24 hr tablet TAKE ONE TABLET BY MOUTH ONCE DAILY WITH BREAKFAST 90 tablet 1  . Multiple Vitamin (MULTIVITAMIN WITH MINERALS) TABS tablet Take 1 tablet by mouth as needed.      No current facility-administered medications on file prior to visit.     BP (!) 146/72 (BP Location: Left Arm, Patient Position: Sitting, Cuff Size: Normal)   Pulse (!) 110   Temp 97.9 F (36.6 C) (Oral)   Ht 5\' 6"  (1.676 m)   Wt 122 lb 12.8 oz (55.7 kg)   SpO2 96%   BMI 19.82 kg/m     Review of Systems  Constitutional: Positive for activity change, appetite change, fatigue and unexpected weight change.  HENT: Negative for congestion, dental problem, hearing loss, rhinorrhea, sinus pressure, sore throat and tinnitus.   Eyes: Negative for pain, discharge and visual disturbance.  Respiratory: Negative for cough and shortness of breath.   Cardiovascular: Negative for chest pain, palpitations and leg swelling.  Gastrointestinal: Positive for abdominal pain. Negative for abdominal distention, blood in stool, constipation, diarrhea, nausea and vomiting.  Genitourinary: Negative for difficulty urinating, dysuria, flank pain, frequency, hematuria, pelvic pain, urgency, vaginal bleeding, vaginal discharge and vaginal pain.  Musculoskeletal: Positive for gait problem. Negative for arthralgias and joint swelling.  Skin: Negative for rash.  Neurological: Positive for weakness. Negative for dizziness, syncope,  speech difficulty, numbness and headaches.  Hematological: Negative for adenopathy.  Psychiatric/Behavioral: Negative for agitation, behavioral problems and dysphoric mood. The patient is not nervous/anxious.        Objective:   Physical Exam  Constitutional: She is oriented to person, place, and time. She appears well-developed and well-nourished.  Frail but no acute distress Blood pressure 130/70 Pulse 90-100  Weight 123  Requires assistance to stand from a sitting position  HENT:  Head: Normocephalic.  Right Ear: External ear normal.  Left Ear: External ear normal.  Mouth/Throat: Oropharynx is clear and moist.  Eyes: Conjunctivae and EOM are normal. Pupils are equal, round, and reactive to light.  Neck: Normal range of motion. Neck supple. No thyromegaly present.  Cardiovascular: Normal rate, regular rhythm, normal heart sounds and intact distal pulses.   Pulse 90-100  Pulmonary/Chest: Effort normal and breath sounds normal.  Abdominal: Soft. Bowel sounds are normal. She exhibits no mass. There is no tenderness.  No tenderness or obvious mass  Musculoskeletal: Normal range of motion.  Lymphadenopathy:    She has no cervical adenopathy.  Neurological: She is alert and oriented to person, place, and time.  Skin: Skin is warm and dry. No rash noted.  Psychiatric: She has a normal mood and affect. Her behavior is normal.          Assessment & Plan:   Cecal cancer.  Clinical deterioration.  Will check CBC and lab Diabetes mellitus.  Check hemoglobin A1c Essential hypertension.  Blood pressure controlled Dyslipidemia.  Discontinue atorvastatin  Nyoka Cowden

## 2016-08-28 ENCOUNTER — Encounter: Payer: Self-pay | Admitting: Internal Medicine

## 2016-08-28 ENCOUNTER — Ambulatory Visit (INDEPENDENT_AMBULATORY_CARE_PROVIDER_SITE_OTHER): Payer: Medicare Other | Admitting: Internal Medicine

## 2016-08-28 VITALS — BP 120/84 | HR 92 | Temp 98.5°F | Wt 114.2 lb

## 2016-08-28 DIAGNOSIS — E0840 Diabetes mellitus due to underlying condition with diabetic neuropathy, unspecified: Secondary | ICD-10-CM

## 2016-08-28 DIAGNOSIS — I10 Essential (primary) hypertension: Secondary | ICD-10-CM

## 2016-08-28 DIAGNOSIS — K639 Disease of intestine, unspecified: Secondary | ICD-10-CM | POA: Diagnosis not present

## 2016-08-28 DIAGNOSIS — K6389 Other specified diseases of intestine: Secondary | ICD-10-CM

## 2016-08-28 DIAGNOSIS — E440 Moderate protein-calorie malnutrition: Secondary | ICD-10-CM

## 2016-08-28 DIAGNOSIS — R4189 Other symptoms and signs involving cognitive functions and awareness: Secondary | ICD-10-CM

## 2016-08-28 DIAGNOSIS — D500 Iron deficiency anemia secondary to blood loss (chronic): Secondary | ICD-10-CM

## 2016-08-28 LAB — POCT GLUCOSE (DEVICE FOR HOME USE): POC GLUCOSE: 133 mg/dL — AB (ref 70–99)

## 2016-08-28 NOTE — Progress Notes (Signed)
Pre visit review using our clinic review tool, if applicable. No additional management support is needed unless otherwise documented below in the visit note. 

## 2016-08-28 NOTE — Progress Notes (Signed)
Subjective:    Patient ID: Kathy Beltran, female    DOB: March 17, 1928, 83 y.o.   MRN: [id number]  HPI  83 year old patient who is seen today in follow-up. For the past 6 weeks she has continued to deteriorate.  Very little appetite and has had some significant weight gain.  Lab Results  Component Value Date   HGBA1C 7.1 (H) 07/19/2016   Denies any abdominal pain.  She lives alone but does have a son who checks on her at least twice daily Last month, hemoglobin was fairly stable, but had significant hyperglycemia Random blood sugar today fairly well controlled  Past Medical History:  Diagnosis Date  . ANXIETY 12/19/2006  . DIABETES MELLITUS, TYPE II 12/19/2006  . HYPERTENSION 12/19/2006  . OSTEOARTHRITIS 12/19/2006  . Shortness of breath dyspnea      Social History   Social History  . Marital status: Widowed    Spouse name: N/A  . Number of children: 2  . Years of education: N/A   Occupational History  . Retired    Social History Main Topics  . Smoking status: Never Smoker  . Smokeless tobacco: Never Used  . Alcohol use No  . Drug use: No  . Sexual activity: Not on file   Other Topics Concern  . Not on file   Social History Narrative  . No narrative on file    Past Surgical History:  Procedure Laterality Date  . COLONOSCOPY WITH PROPOFOL N/A 03/07/2015   Procedure: COLONOSCOPY WITH PROPOFOL;  Surgeon: Gatha Mayer, MD;  Location: [facility];  Service: Endoscopy;  Laterality: N/A;  . HIP OPEN REDUCTION     hip fx    Family History  Problem Relation Age of Onset  . Cancer Mother     renal ca - status post nephrectomy  . Diabetes Mother   . COPD Sister   . Stroke Sister   . Cancer Brother     died of pancreatic ca  . COPD Brother   . Cancer Father     lung cancer     No Known Allergies  Current Outpatient Prescriptions on File Prior to Visit  Medication Sig Dispense Refill  . Ferrous Sulfate (IRON) 325 (65 Fe) MG TABS TAKE ONE TABLET BY MOUTH  THREE TIMES DAILY WITH MEALS 90 each 3  . lisinopril (PRINIVIL,ZESTRIL) 20 MG tablet TAKE ONE TABLET BY MOUTH ONCE DAILY 30 tablet 5  . metFORMIN (GLUCOPHAGE-XR) 500 MG 24 hr tablet TAKE ONE TABLET BY MOUTH ONCE DAILY WITH BREAKFAST 90 tablet 1  . Multiple Vitamin (MULTIVITAMIN WITH MINERALS) TABS tablet Take 1 tablet by mouth as needed.      No current facility-administered medications on file prior to visit.     BP 120/84 (BP Location: Left Arm, Patient Position: Sitting, Cuff Size: Normal)   Pulse 92   Temp 98.5 F (36.9 C) (Oral)   Wt 114 lb 3.2 oz (51.8 kg)   SpO2 98%   BMI 18.43 kg/m     Review of Systems  Constitutional: Positive for activity change, appetite change, fatigue and unexpected weight change.  HENT: Negative for congestion, dental problem, hearing loss, rhinorrhea, sinus pressure, sore throat and tinnitus.   Eyes: Negative for pain, discharge and visual disturbance.  Respiratory: Negative for cough and shortness of breath.   Cardiovascular: Negative for chest pain, palpitations and leg swelling.  Gastrointestinal: Negative for abdominal distention, abdominal pain, blood in stool, constipation, diarrhea, nausea and vomiting.  Genitourinary: Negative for difficulty  urinating, dysuria, flank pain, frequency, hematuria, pelvic pain, urgency, vaginal bleeding, vaginal discharge and vaginal pain.  Musculoskeletal: Negative for arthralgias, gait problem and joint swelling.  Skin: Negative for rash.  Neurological: Negative for dizziness, syncope, speech difficulty, weakness, numbness and headaches.  Hematological: Negative for adenopathy.  Psychiatric/Behavioral: Negative for agitation, behavioral problems and dysphoric mood. The patient is not nervous/anxious.        Objective:   Physical Exam  Constitutional: She is oriented to person, place, and time. She appears well-developed and well-nourished.  Seems fairly alert and appropriate  Afebrile Blood pressure well  controlled.  O2 saturation 98  HENT:  Head: Normocephalic.  Right Ear: External ear normal.  Left Ear: External ear normal.  Mouth/Throat: Oropharynx is clear and moist.  Eyes: Conjunctivae and EOM are normal. Pupils are equal, round, and reactive to light.  Neck: Normal range of motion. Neck supple. No thyromegaly present.  Cardiovascular: Normal rate, regular rhythm, normal heart sounds and intact distal pulses.   Pulmonary/Chest: Effort normal and breath sounds normal.  Abdominal: Soft. Bowel sounds are normal. She exhibits no distension and no mass. There is no tenderness. There is no rebound.  Musculoskeletal: Normal range of motion.  Lymphadenopathy:    She has no cervical adenopathy.  Neurological: She is alert and oriented to person, place, and time.  Skin: Skin is warm and dry. No rash noted.  Psychiatric: She has a normal mood and affect. Her behavior is normal.          Assessment & Plan:   Anorexia/weight loss/colon cancer Stable anemia Hypertension, stable  No change in therapy Son will consider getting hospice involved again  Recheck 3 months  Conall Vangorder Pilar Plate

## 2016-08-28 NOTE — Patient Instructions (Addendum)
WE NOW OFFER   [facility]athy [facility] FAST TRACK!!!  SAME DAY Appointments for ACUTE CARE  Such as: Sprains, Injuries, cuts, abrasions, rashes, muscle pain, joint pain, back pain Colds, flu, sore throats, headache, allergies, cough, fever  Ear pain, sinus and eye infections Abdominal pain, nausea, vomiting, diarrhea, upset stomach Animal/insect bites  3 Easy Ways to Schedule: Walk-In Scheduling Call in scheduling Mychart Sign-up: [email or web address]    Encourage him assist with meals Return in 3 months for follow-up No change in medical regimen

## 2016-09-12 ENCOUNTER — Telehealth: Payer: Self-pay | Admitting: Internal Medicine

## 2016-09-12 NOTE — Telephone Encounter (Signed)
Will Dr. Burnice Logan agree to be attending of records?

## 2016-09-13 NOTE — Telephone Encounter (Signed)
Please advise 

## 2016-09-14 NOTE — Telephone Encounter (Signed)
yes

## 2016-09-14 NOTE — Telephone Encounter (Signed)
Hospice was informed 

## 2016-10-01 ENCOUNTER — Telehealth: Payer: Self-pay | Admitting: Internal Medicine

## 2016-10-01 NOTE — Telephone Encounter (Signed)
[name] with Hospice would like a call back. Pt is having some new pain symptoms and [name] would like to discuss with you. Pt not currently on anything for pain.

## 2016-10-02 NOTE — Telephone Encounter (Signed)
Please advise 

## 2016-10-03 ENCOUNTER — Telehealth: Payer: Self-pay | Admitting: Internal Medicine

## 2016-10-03 NOTE — Telephone Encounter (Signed)
Please call [name] and  get a recommendation for pain control

## 2016-10-03 NOTE — Telephone Encounter (Signed)
Pt is going to be transferring to [facility] on 10/04/16

## 2016-10-04 NOTE — Telephone Encounter (Signed)
Called and left a detailed message on [name]'s voicemail to call office back.

## 2016-10-04 NOTE — Telephone Encounter (Signed)
Please note

## 2016-10-12 DEATH — deceased

## 2016-10-31 ENCOUNTER — Other Ambulatory Visit: Payer: Self-pay | Admitting: Family Medicine

## 2016-10-31 NOTE — Telephone Encounter (Signed)
Patient would like a 90 day supply.  Please send to the pharmacy.

## 2016-11-01 MED ORDER — LISINOPRIL 20 MG PO TABS: 20.0000 mg | ORAL_TABLET | Freq: Every day | ORAL | 1 refills | Status: AC

## 2016-11-27 ENCOUNTER — Ambulatory Visit: Payer: Medicare Other | Admitting: Internal Medicine

## 2017-02-02 IMAGING — CT CT ABD-PELV W/ CM
1 of 3 series · 13 of 32 positions shown, 18 images · IV contrast (omnipaque)
Comparison: None.

CLINICAL DATA: Heme-positive stools, anorexia, weight loss, change
in bowel habits, iron deficiency anemia

EXAM:
CT ABDOMEN AND PELVIS WITH CONTRAST
TECHNIQUE: Multidetector CT imaging of the abdomen and pelvis was performed
using the standard protocol following bolus administration of
intravenous contrast.
CONTRAST:  100mL OMNIPAQUE IOHEXOL 300 MG/ML  SOLN

[Series 2: rtn ap with st · axial · 0.78mm/px · z∈[-430,-40]mm · 13 of 88 slices shown, 18 images]
[im 5/88  soft-tissue]
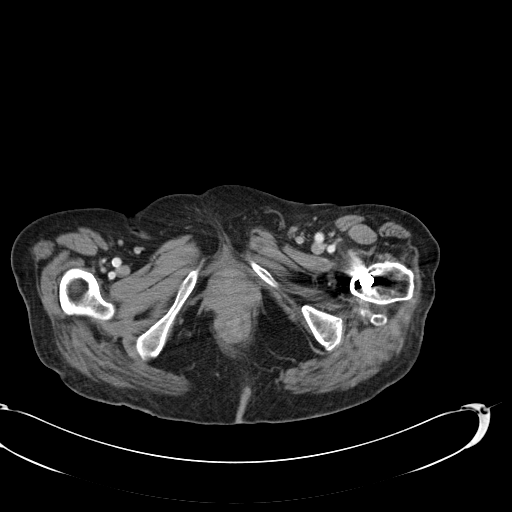
[im 5/88  bone]
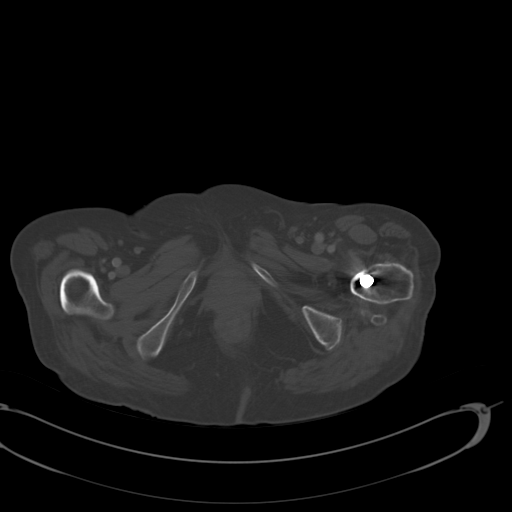
[im 15/88  soft-tissue]
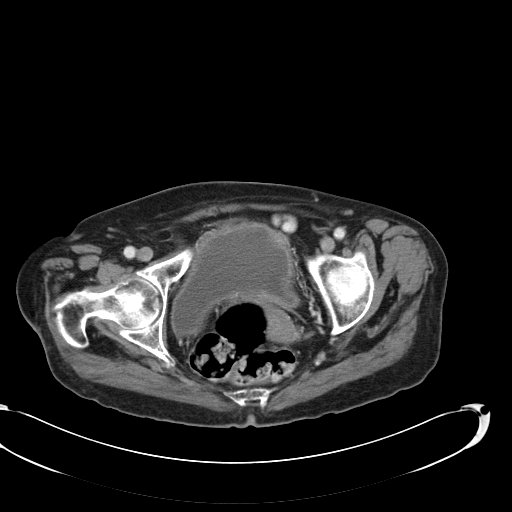
[im 20/88  soft-tissue]
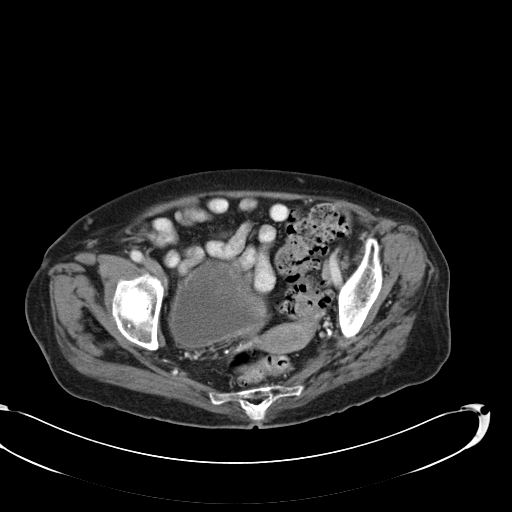
[im 25/88  soft-tissue]
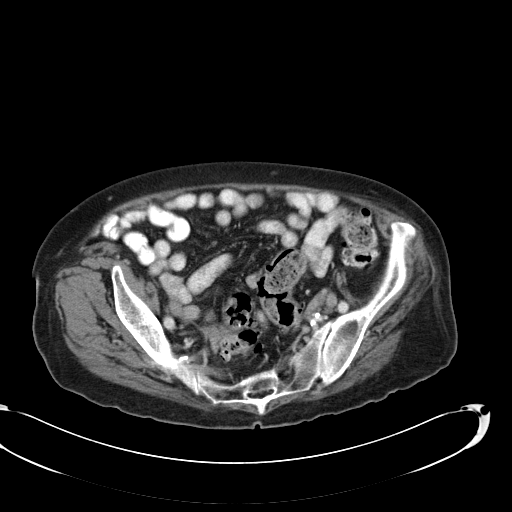
[im 34/88  soft-tissue]
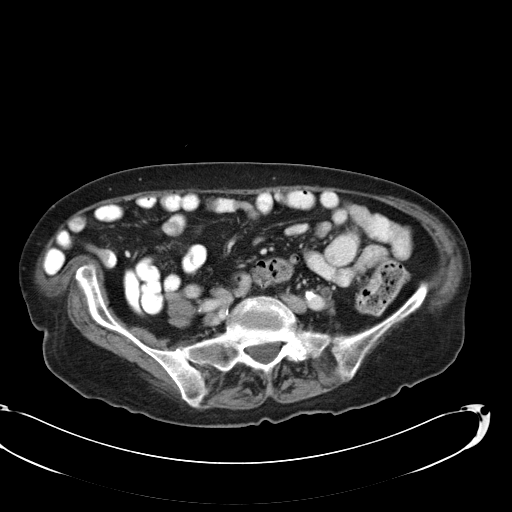
[im 39/88  soft-tissue]
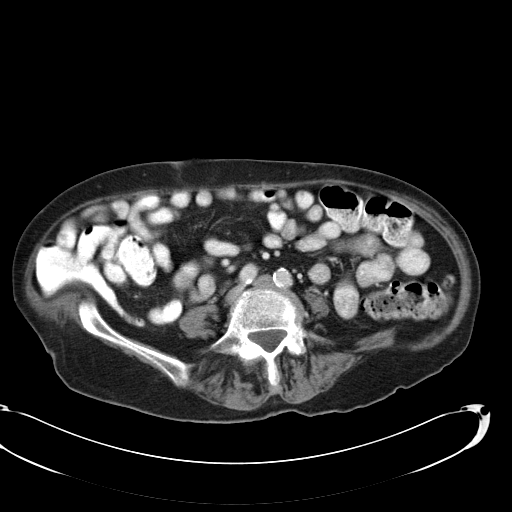
[im 49/88  soft-tissue]
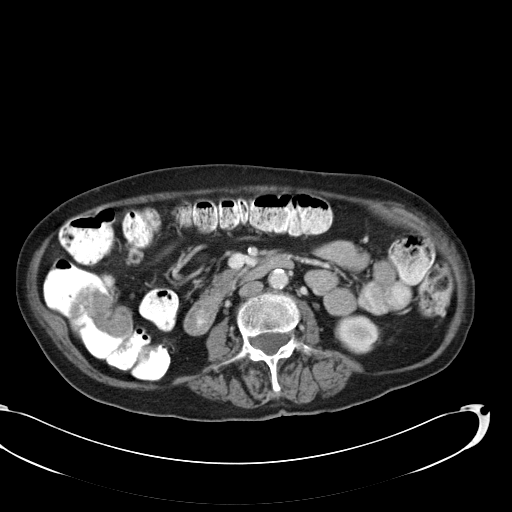
[im 54/88  soft-tissue]
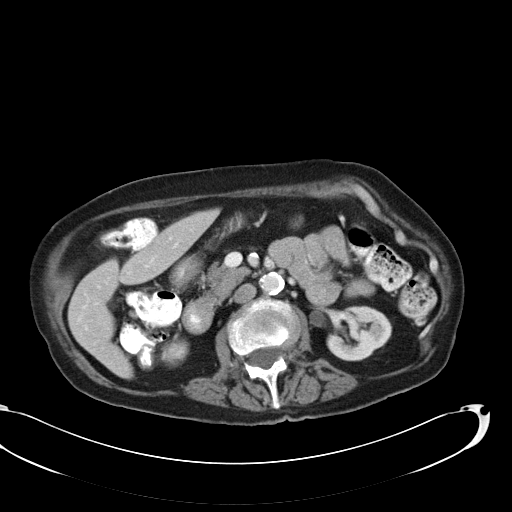
[im 63/88  soft-tissue]
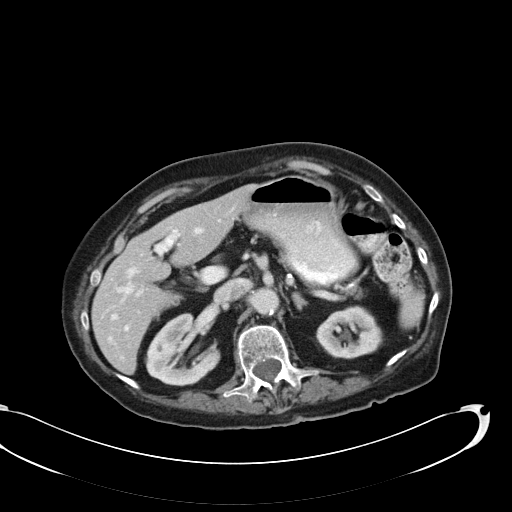
[im 63/88  bone]
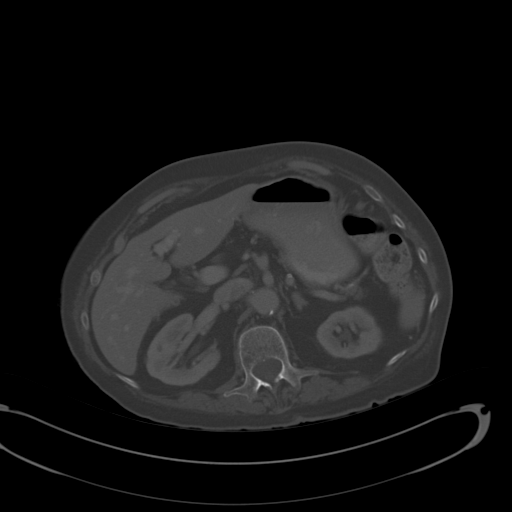
[im 68/88  soft-tissue]
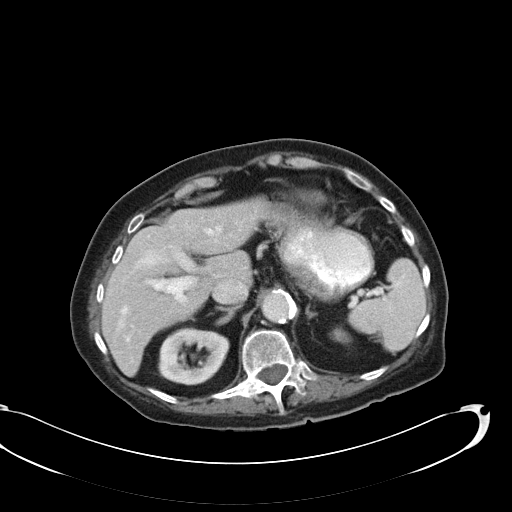
[im 68/88  lung]
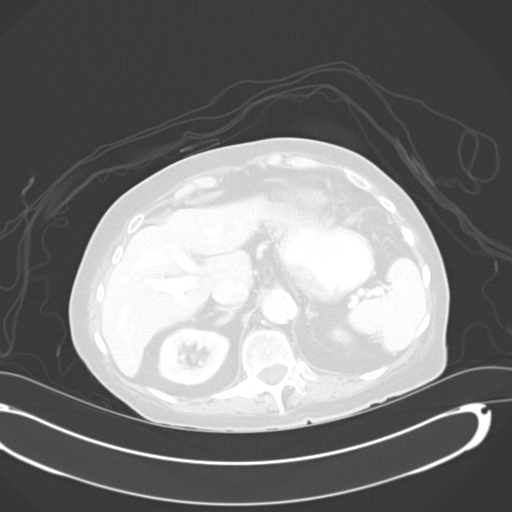
[im 73/88  soft-tissue]
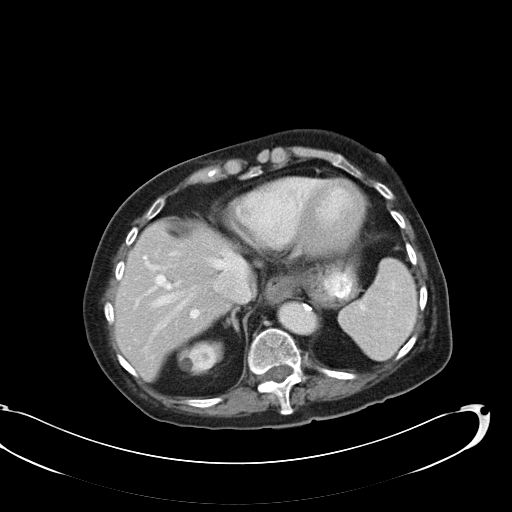
[im 73/88  lung]
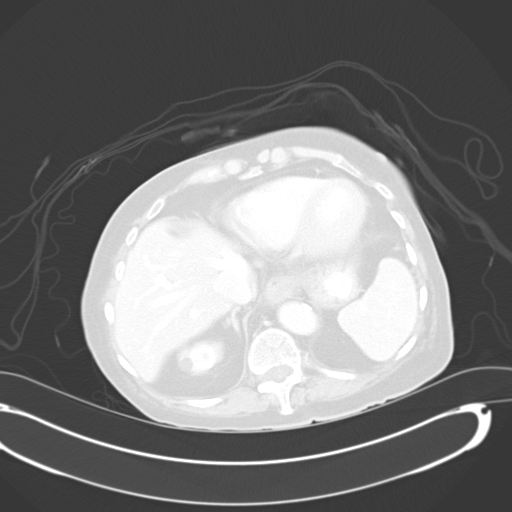
[im 78/88  lung]
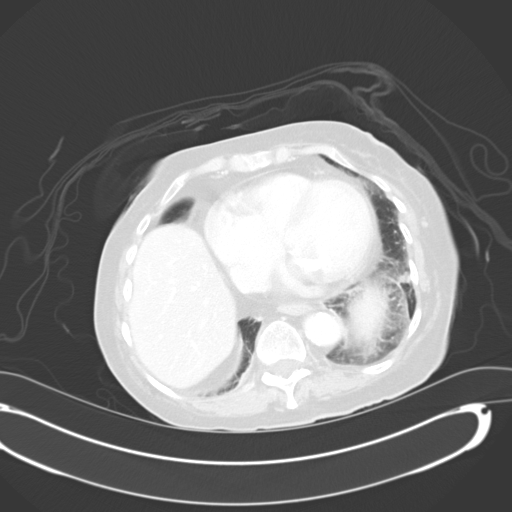
[im 83/88  soft-tissue]
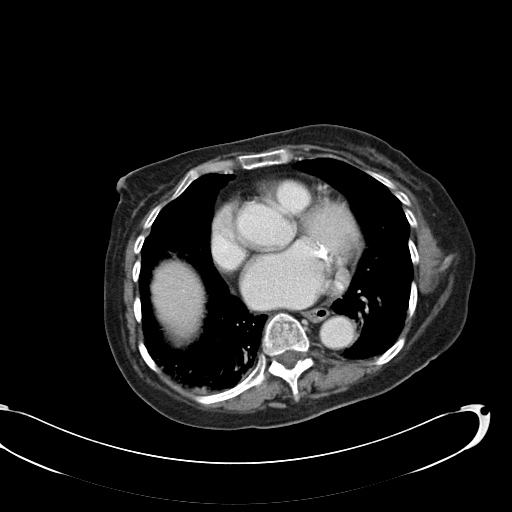
[im 83/88  lung]
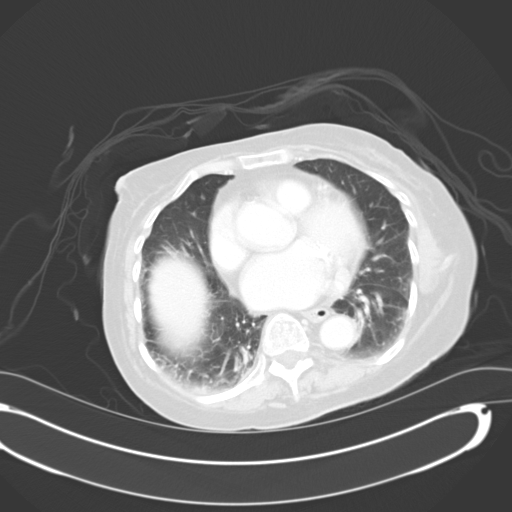

[13 of 32 positions shown; findings below may reference images not displayed]

FINDINGS: There is mild atelectasis at the lung bases. A small nodule
peripherally in the left lung base on image number 11 cannot be
excluded. The heart is mildly enlarged. Calcification of both the
mitral and aortic valves is noted. The liver enhances and only a
tiny sub cm cyst is noted in the medial segment of the left lobe of
liver. A single gallstone layers within the gallbladder. The
pancreas is normal in size and the pancreatic duct is not dilated.
The adrenal glands and spleen are unremarkable. The stomach is
moderately distended with oral contrast and is unremarkable. No
renal calculi are seen. Small low-attenuation rounded renal lesions
are present bilaterally consistent with small cysts. On delayed
images, the pelvocaliceal systems are unremarkable and the proximal
ureters are normal in caliber. Significant atherosclerotic disease
of the abdominal aorta is present without aneurysmal dilatation.

An umbilical hernia is noted containing only fat. The urinary
bladder is moderately urine distended. The uterus is to the left of
midline and unremarkable. No adnexal lesion is seen. No fluid is
noted within the pelvis. There are multiple rectosigmoid colon
diverticula present. Diverticula also are scattered throughout the
more proximal colon. However, there is abnormal soft tissue within
the right colon at the level of the ileocecal valve suspicious for
tumor. This area measures 3.1 x 5.0 x 4.8 cm worrisome for right
colon carcinoma. Only small adjacent lymph nodes are present, the
largest measuring 9 mm in short axis diameter. The pericolonic fat
planes of the right colon appear preserved. Left hip pinning is
noted. The lumbar vertebrae are in normal alignment with probable
minimal compression of the anterior superior aspect of T12 and L2.
IMPRESSION: 1. Lobular soft tissue mass in the right colon which appears to
involve the ileocecal valve and is worrisome for a right-sided colon
carcinoma. An adjacent 9 mm short axis lymph node is present at that
site.
2. Multiple diffuse colonic diverticula primarily within the
rectosigmoid colon.
3. Questionable nodule in the left lung base. Consider CT of the
chest in view of the right-sided colon finding.
4. Single gallstone within the gallbladder.
5. Umbilical hernia containing only fat.
